# Patient Record
Sex: Female | Born: 2005 | Race: Black or African American | Hispanic: No | Marital: Single | Smoking: Former smoker
Health system: Southern US, Community
[De-identification: ages and names within clinical notes are randomized; demographics above are authoritative.]

## PROBLEM LIST (undated history)

## (undated) ENCOUNTER — Inpatient Hospital Stay (HOSPITAL_COMMUNITY): Payer: Self-pay

## (undated) ENCOUNTER — Emergency Department (HOSPITAL_COMMUNITY): Admission: EM | Disposition: A | Discharge status: Home / Self Care | Payer: Medicaid Other

## (undated) DIAGNOSIS — E119 Type 2 diabetes mellitus without complications: Secondary | ICD-10-CM

## (undated) DIAGNOSIS — F32A Depression, unspecified: Secondary | ICD-10-CM

## (undated) DIAGNOSIS — F419 Anxiety disorder, unspecified: Secondary | ICD-10-CM

## (undated) DIAGNOSIS — K5900 Constipation, unspecified: Secondary | ICD-10-CM

## (undated) HISTORY — PX: NO PAST SURGERIES: SHX2092

---
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
## 2024-01-02 ENCOUNTER — Ambulatory Visit (HOSPITAL_COMMUNITY): Admitting: Physician Assistant

## 2024-01-07 ENCOUNTER — Ambulatory Visit: Attending: Obstetrics and Gynecology | Admitting: Obstetrics and Gynecology

## 2024-01-07 ENCOUNTER — Ambulatory Visit (HOSPITAL_BASED_OUTPATIENT_CLINIC_OR_DEPARTMENT_OTHER)

## 2024-01-07 VITALS — BP 103/70

## 2024-01-07 DIAGNOSIS — O358XX0 Maternal care for other (suspected) fetal abnormality and damage, not applicable or unspecified: Secondary | ICD-10-CM | POA: Diagnosis not present

## 2024-01-07 DIAGNOSIS — Z363 Encounter for antenatal screening for malformations: Secondary | ICD-10-CM | POA: Insufficient documentation

## 2024-01-07 DIAGNOSIS — O0990 Supervision of high risk pregnancy, unspecified, unspecified trimester: Secondary | ICD-10-CM

## 2024-01-07 DIAGNOSIS — O34219 Maternal care for unspecified type scar from previous cesarean delivery: Secondary | ICD-10-CM | POA: Diagnosis not present

## 2024-01-07 DIAGNOSIS — O99212 Obesity complicating pregnancy, second trimester: Secondary | ICD-10-CM

## 2024-01-07 DIAGNOSIS — O283 Abnormal ultrasonic finding on antenatal screening of mother: Secondary | ICD-10-CM

## 2024-01-07 DIAGNOSIS — Z3A22 22 weeks gestation of pregnancy: Secondary | ICD-10-CM | POA: Diagnosis not present

## 2024-01-07 DIAGNOSIS — O99210 Obesity complicating pregnancy, unspecified trimester: Secondary | ICD-10-CM | POA: Diagnosis not present

## 2024-01-07 DIAGNOSIS — E669 Obesity, unspecified: Secondary | ICD-10-CM | POA: Diagnosis not present

## 2024-01-07 DIAGNOSIS — O35BXX0 Maternal care for other (suspected) fetal abnormality and damage, fetal cardiac anomalies, not applicable or unspecified: Secondary | ICD-10-CM

## 2024-01-07 DIAGNOSIS — O09892 Supervision of other high risk pregnancies, second trimester: Secondary | ICD-10-CM | POA: Diagnosis not present

## 2024-01-07 NOTE — Progress Notes (Unsigned)
 Maternal-Fetal Medicine Consultation  Name: Sandra Booth  MRN: [id number]  GA: [id number] [redacted]w[redacted]d   Patient is here for fetal anatomy scan.  On cell-free fetal DNA screening, the risks of aneuploidies are not increased. MSAFP screening showed low risk for open-neural tube defects. Obstetrical history is significant for a term cesarean delivery in July 2024 of a female infant weighing 7 pounds and 2 ounces at birth.  Cesarean section was performed because of nonreassuring fetal heart trace. Ultrasound Fetal biometry is consistent with her previously-established dates. Normal amniotic fluid.  An echogenic intracardiac focus is seen.  No other makers of aneuploidies or fetal structural defects are seen.  Patient understands the limitations of ultrasound in detecting fetal anomalies.   Echogenic intracardiac focus (EIF) EIF is present in about 3% to 4% of normal fetuses and in some fetuses with Down syndrome.  Given that she has low risk for fetal Down syndrome on cell-free fetal DNA screening, this should be considered a normal variant.  I did not recommend amniocentesis for this finding.  I counseled the patient that cell free fetal DNA screening has a greater detection rate for Down syndrome. However, only amniocentesis will give a definitive result on the fetal karyotype.  I explained amniocentesis procedure and possible complication of miscarriage (1 and 500 procedures). Patient opted not to have amniocentesis.  Previous cesarean delivery I reassured the patient of normal placental location and that there is no evidence of previa or placenta accreta spectrum.   I discussed the benefits and risks of vaginal birth after cesarean section (VBAC).  The risk of uterine scar dehiscence is about 1%.  Cesarean deliveries increase the risks of hemorrhage, infection and venous thromboembolism.  Repeat cesarean deliveries increase the risks of placenta previa and/or placenta accreta spectrum. Long-term  complications include small bowel obstruction.  Based on Maternal-Fetal [facility] calculator, the likelihood of successful vaginal delivery is 60%. Patient was counseled that this is only an approximate calculation.  Patient is keen on VBAC.  Short Interpregnancy Interval It is defined as the time interval (18 to 24 months) between the end of previous pregnancy and the beginning of next pregnancy. The impact of short pregnancy interval on the outcome of subsequent pregnancy is uncertain. Some studies have shown that congenital anomalies, preterm delivery, and fetal growth restriction rates are increased in pregnancies with short interpregnancy interval.  However, it is not supported by other reports. Overall, we should expect good pregnancy outcomes if there are no other high-risk factors.  Teen pregnancy Teen pregnancy is associated with increased risk of adverse outcomes including preeclampsia, fetal growth restriction, preterm birth and increased to perinatal mortality.  Substance use disorder and mental health problems have been reported to be associated with teen pregnancy. I counseled the patient on teen pregnancy and recommended a fetal growth assessment at [redacted] weeks gestation.  Recommendations - Fetal growth assessment in 10 weeks.   Consultation including face-to-face (more than 50%) counseling 45 minutes.

## 2024-01-08 ENCOUNTER — Other Ambulatory Visit: Payer: Self-pay | Admitting: *Deleted

## 2024-01-08 ENCOUNTER — Ambulatory Visit: Payer: Self-pay | Admitting: Family Medicine

## 2024-01-08 DIAGNOSIS — O09892 Supervision of other high risk pregnancies, second trimester: Secondary | ICD-10-CM

## 2024-01-08 DIAGNOSIS — O0990 Supervision of high risk pregnancy, unspecified, unspecified trimester: Secondary | ICD-10-CM

## 2024-01-08 DIAGNOSIS — O99212 Obesity complicating pregnancy, second trimester: Secondary | ICD-10-CM

## 2024-01-08 DIAGNOSIS — O358XX0 Maternal care for other (suspected) fetal abnormality and damage, not applicable or unspecified: Secondary | ICD-10-CM

## 2024-01-15 ENCOUNTER — Encounter (HOSPITAL_COMMUNITY): Admitting: Psychiatry

## 2024-01-16 ENCOUNTER — Encounter (HOSPITAL_COMMUNITY): Payer: Self-pay

## 2024-01-21 ENCOUNTER — Other Ambulatory Visit: Payer: Self-pay

## 2024-01-21 ENCOUNTER — Ambulatory Visit

## 2024-01-21 ENCOUNTER — Ambulatory Visit (HOSPITAL_COMMUNITY): Admitting: Clinical

## 2024-01-21 VITALS — BP 107/62 | HR 111 | Wt 199.0 lb

## 2024-01-21 DIAGNOSIS — O09892 Supervision of other high risk pregnancies, second trimester: Secondary | ICD-10-CM

## 2024-01-21 DIAGNOSIS — Z3A24 24 weeks gestation of pregnancy: Secondary | ICD-10-CM

## 2024-01-21 DIAGNOSIS — Z98891 History of uterine scar from previous surgery: Secondary | ICD-10-CM

## 2024-01-21 DIAGNOSIS — F5083 Pica in adults: Secondary | ICD-10-CM

## 2024-01-21 DIAGNOSIS — F419 Anxiety disorder, unspecified: Secondary | ICD-10-CM

## 2024-01-21 DIAGNOSIS — F322 Major depressive disorder, single episode, severe without psychotic features: Secondary | ICD-10-CM

## 2024-01-21 DIAGNOSIS — O99210 Obesity complicating pregnancy, unspecified trimester: Secondary | ICD-10-CM

## 2024-01-21 NOTE — Patient Instructions (Signed)
 Safe Medications in Pregnancy  - Take medications as directed on the package. Medications are listed by Brand name, store brands are considered equivalent to brand name, just be sure that the medications are the same. Ex: Tylenol (acetaminophen) - If taking multiple medications, please check labels to avoid duplicating the same active ingredients - Do not exceed 4000 mg of Tylenol (acetaminophen) in 24 hours - Do not take medications that contain aspirin  or ibuprofen  (Motrin , Advil ) or naproxen (Aleve, Naprosyn)  Acne Benzoyl Peroxide Salicylic Acid  Allergies Benadryl  (diphenhydramine ) Claritin (loratadine) Flonase nasal spray (fluticasone) Saline nasal spray/drops  Backache/Headache Acetaminophen (Tylenol): 2 regular strength (325mg ) every 4 hours OR 2 extra strength (500mg ) every 6 hours  Colds/Coughs Breathe right strips Cepacol throat lozenges OR Chloraseptic throat spray cough drops, alcohol free Delsym (dextromethorphan, cough suppressant) Mucinex (guaifenesin, mucolytic/expectorant) Robitussin DM (dextromethorphan + guaifenesin) Saline nasal spray/drops Sudafed (pseudoephedrine, decongestant)  only after [redacted] weeks gestation and if you do not have high blood pressure Vicks Vaporub Zinc lozenges Zyrtec (cetirizine)  Constipation Immediate relief Ducolax suppositories (bisacodyl) Fleet enema (saline enema) glycerin suppositories milk of magnesia Senokot (overnight) Smooth move tea (overnight)  to keep you regular Colace, Dulcolax (docusate) Metamucil (psyllium fiber) Miralax (polyethylene glycol)   Diarrhea Kaopectate (bismuth subsalicylate) Imodium A-D (loperamide) *NO pepto Bismol Hemorrhoids Anusol   Anusol  HC (Rx only) Preparation H Tucks  Indigestion Tums Maalox Mylanta Pepcid  Insomnia Benadryl  (alcohol free) 25mg  every 6 hours as needed Tylenol PM Unisom, no Gelcaps  Leg  Cramps Tums MagGel  Nausea/Vomiting Bonine Dramamine Emetrol Ginger extract Sea bands Meclizine (Rx only)  Nausea medication to take during pregnancy Unisom (doxylamine succinate 25 mg tablets) Take one half tablet daily at bedtime. Vitamin B6 100mg  tablets. Take one tablet twice a day (up to 200 mg per day).  Skin Rashes: Aveeno products Benadryl  cream or 25mg  pill every 6 hours as needed Calamine Lotion 1% cortisone cream  Yeast infection: Gyne-lotrimin 7 Monistat 7

## 2024-01-21 NOTE — Progress Notes (Signed)
   PRENATAL VISIT NOTE Subjective:  Sandra Booth is a 18 y.o. G2P1001 at [redacted]w[redacted]d being seen today for ongoing prenatal care.  She is currently monitored for the following issues for this high-risk pregnancy and  Patient Active Problem List   Diagnosis Date Noted   High risk teen pregnancy 12/24/2023   Obesity affecting pregnancy, antepartum 12/24/2023   Short interval between pregnancies affecting pregnancy in second trimester, antepartum 11/27/2023   Supervision of high risk pregnancy, antepartum 10/16/2023   History of cesarean delivery 08/28/2022   Carbon monoxide exposure 08/21/2022   Pica in adults 03/21/2022   At risk for domestic violence 03/17/2022   Anxiety 02/17/2022   Chronic constipation 02/16/2022   Cannabis use disorder, mild, abuse 02/23/2021   MDD (major depressive disorder), single episode, severe , no psychosis (HCC) 02/22/2021    Patient reports no complaints.   Contractions: Not present.  Vag. Bleeding: None.   Movement: Present.  Denies leaking of fluid.    The following portions of the patient's history were reviewed and updated as appropriate: allergies, current medications, past family history, past medical history, past social history, past surgical history and problem list. Problem list updated.  Objective:   Vitals:   01/21/24 1534  BP: 107/62  Pulse: (!) 111  Weight: 199 lb (90.3 kg)    Fetal Status: Fetal Heart Rate (bpm): 141 Fundal Height: 26 cm Movement: Present     General:  Alert, oriented and cooperative. Patient is in no acute distress.  Skin: Skin is warm and dry. No rash noted.   Cardiovascular: Normal heart rate noted  Respiratory: Normal respiratory effort, no problems with respiration noted  Abdomen: Soft, gravid, appropriate for gestational age. Pain/Pressure: Absent     Pelvic: Vag. Bleeding: None     Cervical exam deferred        Extremities: Normal range of motion.  Edema: None  Mental Status: Normal mood and affect. Normal  behavior. Normal judgment and thought content.   Urinalysis:      Assessment and Plan:  Sandra Booth is 18 y.o. G2P1001 at [redacted]w[redacted]d, 05/11/2024, by Last Menstrual Period  1. High risk teen pregnancy in second trimester (Primary) 2. Obesity affecting pregnancy, antepartum, unspecified obesity type 3. [redacted] weeks gestation of pregnancy BP, FHT, and FH appropriate. Anatomy with EICF, however low risk NIPT. Declined amniocentesis.   4. History of cesarean delivery 5. Short interval between pregnancies affecting pregnancy in second trimester, antepartum Desires TOLAC. G1 08/2022, pLTCS for NRFHT. Per MFM 01/07/2024, 60% likelihood of successful VBAC.   6. MDD (major depressive disorder), single episode, severe , no psychosis (HCC) 7. Anxiety 7. Pica in adults Engaged with [facility]. Appt today and tomorrow.    Preterm labor symptoms and general obstetric precautions including but not limited to vaginal bleeding, contractions, leaking of fluid and fetal movement were reviewed in detail with the patient. Please refer to After Visit Summary for other counseling recommendations.   Return in about 4 weeks (around 02/18/2024) for [facility], 28wk lab. Cancel 12/12 appts   Barabara Maier, DO FM-OB Fellow [facility] for [employer]

## 2024-01-22 ENCOUNTER — Encounter (HOSPITAL_COMMUNITY): Admitting: Psychiatry

## 2024-01-25 ENCOUNTER — Other Ambulatory Visit

## 2024-01-25 ENCOUNTER — Encounter: Admitting: Family Medicine

## 2024-01-30 ENCOUNTER — Encounter (HOSPITAL_COMMUNITY): Payer: Self-pay | Admitting: Obstetrics & Gynecology

## 2024-01-30 ENCOUNTER — Other Ambulatory Visit: Payer: Self-pay

## 2024-01-30 ENCOUNTER — Inpatient Hospital Stay (HOSPITAL_COMMUNITY)
Admission: AD | Admit: 2024-01-30 | Discharge: 2024-01-30 | Disposition: A | Discharge status: Home / Self Care | Source: Home / Self Care | Attending: Obstetrics & Gynecology | Admitting: Obstetrics & Gynecology

## 2024-01-30 DIAGNOSIS — Z3689 Encounter for other specified antenatal screening: Secondary | ICD-10-CM | POA: Diagnosis not present

## 2024-01-30 DIAGNOSIS — Z3492 Encounter for supervision of normal pregnancy, unspecified, second trimester: Secondary | ICD-10-CM

## 2024-01-30 DIAGNOSIS — O99891 Other specified diseases and conditions complicating pregnancy: Secondary | ICD-10-CM | POA: Insufficient documentation

## 2024-01-30 DIAGNOSIS — Z3A25 25 weeks gestation of pregnancy: Secondary | ICD-10-CM

## 2024-01-30 DIAGNOSIS — O368120 Decreased fetal movements, second trimester, not applicable or unspecified: Secondary | ICD-10-CM | POA: Insufficient documentation

## 2024-01-30 DIAGNOSIS — R040 Epistaxis: Secondary | ICD-10-CM | POA: Diagnosis not present

## 2024-01-30 LAB — URINALYSIS, ROUTINE W REFLEX MICROSCOPIC
Bilirubin Urine: NEGATIVE
Glucose, UA: NEGATIVE mg/dL
Hgb urine dipstick: NEGATIVE
Ketones, ur: NEGATIVE mg/dL
Nitrite: NEGATIVE
Protein, ur: NEGATIVE mg/dL
Specific Gravity, Urine: 1.018 (ref 1.005–1.030)
pH: 7 (ref 5.0–8.0)

## 2024-01-30 NOTE — Discharge Instructions (Signed)
 What is a fetal movement count?  A fetal movement count  is the number of times that you feel your baby move during a certain amount of time. This may also be called a fetal kick count by some people, but fetal movement count is a more accurate term. Movements can be kicks, flutters, swishes, rolls, or jabs. A fetal movement count is recommended for every pregnant woman. You may be asked to start counting fetal movements as early as week 28 of your pregnancy. Pay attention to when your baby is most active. Your baby has times of activity and times to sleep just like you do! You may notice your baby's sleep and wake cycles. You may also notice things that make your baby move more. You should do a fetal movement count: When your baby is normally most active. At the same time each day. A good time to count movements is while you are resting, after having something to eat and drink.  How do I count fetal movements? Find a quiet, comfortable area. Sit, or lie down on your side. Write down the date, the start time and stop time, and the number of movements that you felt between those two times. Take this information with you to your health care visits. Write down your start time when you feel the first movement. Count kicks, flutters, swishes, rolls, and jabs. You should feel at least 10 movements. You may stop counting after you have felt 10 movements, or if you have been counting for 2 hours. Write down the stop time. If you do not feel 10 movements in 2 hours, contact your health care provider for further instructions. Your health care provider may want to do additional tests to assess your baby's well-being. Contact a health care provider if: You feel fewer than 10 movements in 2 hours. Your baby is not moving like he or she usually does.

## 2024-01-30 NOTE — MAU Note (Signed)
 RN offered bus transportation for discharge home. Patient declined, and will arrange her own transportation home.

## 2024-01-30 NOTE — MAU Note (Signed)
 MAU Triage Note  Sandra Booth is a 18 y.o. at [redacted]w[redacted]d here in MAU reporting: arrived by EMS; has only felt baby move 3 times today. Reports baby is normally very active. Denies VB and LOF.    Onset of complaint: today Pain score: denies Vitals:   01/30/24 2148  BP: 118/87  Pulse: 94  Resp: 17  Temp: 98.5 F (36.9 C)  SpO2: 98%     FHT: 141  Lab orders placed from triage: ua

## 2024-01-30 NOTE — MAU Provider Note (Signed)
 Chief Complaint:  Decreased Fetal Movement   HPI   None     Sandra Booth is a 18 y.o. G2P1001 at [redacted]w[redacted]d who presents to maternity admissions reporting decreased fetal movement. She reports that she has felt fetal movement 3 times today, which is less than she is used to. Denies vaginal bleeding, leaking of fluid, contractions. She does note blood in the tissue after blowing her nose today.  Pregnancy Course: Receives care at [facility] [employer] [facility]. Prenatal records reviewed.   Past Medical History:  Diagnosis Date   Anxiety    Constipation    Depression    doing better-situational   OB History  Gravida Para Term Preterm AB Living  2 1 1  0 0 1  SAB IAB Ectopic Multiple Live Births  0 0 0 0 1    # Outcome Date GA Lbr Len/2nd Weight Sex Type Anes PTL Lv  2 Current           1 Term 08/28/22 [redacted]w[redacted]d  3250 g F CS-LVertical EPI  LIV   Past Surgical History:  Procedure Laterality Date   CESAREAN SECTION N/A 08/28/2022   Procedure: CESAREAN SECTION;  Surgeon: Jayne Vonn DEL, MD;  Location: [facility] LD ORS;  Service: Obstetrics;  Laterality: N/A;   Family History  Problem Relation Age of Onset   Diabetes Mother    Sickle cell anemia Mother    Anemia Father    Diabetes Brother    Heart Problems Paternal Grandmother    Social History[1] Allergies[2] Medications Prior to Admission  Medication Sig Dispense Refill Last Dose/Taking   acetaminophen  (TYLENOL ) 325 MG tablet Take 2 tablets (650 mg total) by mouth every 6 (six) hours as needed for mild pain (pain score 1-3) or moderate pain (pain score 4-6). (Patient not taking: Reported on 01/21/2024) 30 tablet 0    polyethylene glycol powder (MIRALAX ) 17 GM/SCOOP powder Take 17 g by mouth 4 (four) times daily as needed for mild constipation or moderate constipation. Titrate to one to three soft bowel movements per day (Patient not taking: Reported on 01/21/2024) 850 g 0    prenatal vitamin w/FE, FA  (PRENATAL 1 + 1) 27-1 MG TABS tablet Take 1 tablet by mouth daily at 12 noon. (Patient not taking: Reported on 01/21/2024) 30 tablet 11    senna (SENOKOT) 8.6 MG TABS tablet Take 2 tablets (17.2 mg total) by mouth at bedtime as needed for mild constipation. (Patient not taking: Reported on 01/21/2024) 180 tablet 3     I have reviewed patient's Past Medical Hx, Surgical Hx, Family Hx, Social Hx, medications and allergies.   ROS  Pertinent items noted in HPI and remainder of comprehensive ROS otherwise negative.   PHYSICAL EXAM  Patient Vitals for the past 24 hrs:  BP Temp Temp src Pulse Resp SpO2  01/30/24 2159 95/75 -- -- (!) 103 -- 98 %  01/30/24 2148 118/87 98.5 F (36.9 C) Oral 94 17 98 %    Constitutional: Well-developed, well-nourished female in no acute distress.  HEENT: atraumatic, normocephalic. Neck has normal ROM. EOM intact. Cardiovascular: normal rate & rhythm, warm and well-perfused Respiratory: normal effort, no problems with respiration noted GI: Abd soft, non-tender, non-distended MSK: Extremities nontender, no edema, normal ROM Skin: warm and dry. Acyanotic, no jaundice or pallor. Neurologic: Alert and oriented x 4. No abnormal coordination. Psychiatric: Normal mood. Speech not slurred, not rapid/pressured. Patient is cooperative.  Fetal Tracing: Baseline FHR: 140 per  minute, changed to 130 per minute at 2206 Fetal heart variability: moderate Fetal Heart Rate accelerations: yes - 10x10 appropriate for gestational age Fetal Heart Rate decelerations: none Fetal Non-stress Test: Category I (reactive) Toco: no uterine contractions   Labs: No results found for this or any previous visit (from the past 24 hours).  Imaging:  No results found.  MDM & MAU COURSE  MDM: Moderate  MAU Course: -Vital signs within normal limits.  -NST appropriate and reactive for gestational age. -Patient endorses fetal movement while in MAU.  Differential diagnosis considered for  decreased fetal movement includes but is not limited to: fetal sleep, poor maternal perception of movement, medications, early gestational age, decreased/increased amniotic fluid volume, maternal position (sitting or standing versus lying), fetal position (anterior position of the fetal spine), anterior placenta, maternal physical activity   Orders Placed This Encounter  Procedures   Urinalysis, Routine w reflex microscopic -Urine, Clean Catch   Discharge patient   No orders of the defined types were placed in this encounter.   ASSESSMENT   1. NST (non-stress test) reactive   2. Movement of fetus present during pregnancy in second trimester   3. [redacted] weeks gestation of pregnancy     PLAN  Discharge home in stable condition with return precautions.  Discussed fetal movement counts and provided additional information in AVS.  Reassured patient that nose bleeds can be normal during cold and dry weather if they stop with pressure, nasal saline spray is safe to use to prevent.    Allergies as of 01/30/2024   No Known Allergies      Medication List     TAKE these medications    acetaminophen  325 MG tablet Commonly known as: Tylenol  Take 2 tablets (650 mg total) by mouth every 6 (six) hours as needed for mild pain (pain score 1-3) or moderate pain (pain score 4-6).   polyethylene glycol powder 17 GM/SCOOP powder Commonly known as: MiraLax  Take 17 g by mouth 4 (four) times daily as needed for mild constipation or moderate constipation. Titrate to one to three soft bowel movements per day   prenatal vitamin w/FE, FA 27-1 MG Tabs tablet Take 1 tablet by mouth daily at 12 noon.   senna 8.6 MG Tabs tablet Commonly known as: SENOKOT Take 2 tablets (17.2 mg total) by mouth at bedtime as needed for mild constipation.        Joesph DELENA Sear, PA     [1]  Social History Tobacco Use   Smoking status: Former    Types: Cigars    Passive exposure: Current   Smokeless tobacco:  Never  Vaping Use   Vaping status: Some Days   Last attempt to quit: 05/29/2022   Substances: Nicotine, Flavoring  Substance Use Topics   Alcohol use: Not Currently    Comment: twice a year prior to pregnancy   Drug use: Yes    Frequency: 7.0 times per week    Types: Marijuana    Comment: last marijuana use 01/2024  [2] No Known Allergies

## 2024-02-19 ENCOUNTER — Encounter: Payer: Self-pay | Admitting: Family Medicine

## 2024-02-19 ENCOUNTER — Other Ambulatory Visit: Payer: Self-pay

## 2024-02-19 ENCOUNTER — Other Ambulatory Visit

## 2024-02-19 ENCOUNTER — Ambulatory Visit (INDEPENDENT_AMBULATORY_CARE_PROVIDER_SITE_OTHER): Admitting: Family Medicine

## 2024-02-19 VITALS — BP 102/67 | HR 101 | Wt 204.5 lb

## 2024-02-19 DIAGNOSIS — O0990 Supervision of high risk pregnancy, unspecified, unspecified trimester: Secondary | ICD-10-CM | POA: Diagnosis not present

## 2024-02-19 DIAGNOSIS — Z23 Encounter for immunization: Secondary | ICD-10-CM | POA: Diagnosis not present

## 2024-02-19 DIAGNOSIS — F5083 Pica in adults: Secondary | ICD-10-CM

## 2024-02-19 DIAGNOSIS — Z3A28 28 weeks gestation of pregnancy: Secondary | ICD-10-CM

## 2024-02-19 DIAGNOSIS — Z9189 Other specified personal risk factors, not elsewhere classified: Secondary | ICD-10-CM | POA: Diagnosis not present

## 2024-02-19 DIAGNOSIS — O09892 Supervision of other high risk pregnancies, second trimester: Secondary | ICD-10-CM | POA: Diagnosis not present

## 2024-02-19 DIAGNOSIS — F419 Anxiety disorder, unspecified: Secondary | ICD-10-CM | POA: Diagnosis not present

## 2024-02-19 DIAGNOSIS — O0993 Supervision of high risk pregnancy, unspecified, third trimester: Secondary | ICD-10-CM

## 2024-02-19 DIAGNOSIS — Z98891 History of uterine scar from previous surgery: Secondary | ICD-10-CM | POA: Diagnosis not present

## 2024-02-19 MED ORDER — SERTRALINE HCL 50 MG PO TABS: ORAL_TABLET | ORAL | 0 refills | Status: AC

## 2024-02-19 MED ORDER — PRENATAL PLUS 27-1 MG PO TABS: 1.0000 | ORAL_TABLET | Freq: Every day | ORAL | 11 refills | Status: AC

## 2024-02-19 NOTE — Patient Instructions (Signed)

## 2024-02-19 NOTE — Progress Notes (Signed)
" ° °  Subjective:  Sandra Booth is a 19 y.o. G2P1001 at [redacted]w[redacted]d being seen today for ongoing prenatal care.  She is currently monitored for the following issues for this high-risk pregnancy and has MDD (major depressive disorder), single episode, severe , no psychosis (HCC); Cannabis use disorder, mild, abuse; Chronic constipation; Anxiety; At risk for domestic violence; Pica in adults; Carbon monoxide exposure; History of cesarean delivery; Supervision of high risk pregnancy, antepartum; Short interval between pregnancies affecting pregnancy in second trimester, antepartum; High risk teen pregnancy; and Obesity affecting pregnancy, antepartum on their problem list.  Patient reports no complaints.  Contractions: Not present. Vag. Bleeding: None.  Movement: Present. Denies leaking of fluid.   The following portions of the patient's history were reviewed and updated as appropriate: allergies, current medications, past family history, past medical history, past social history, past surgical history and problem list. Problem list updated.  Objective:   Vitals:   02/19/24 0832  BP: 102/67  Pulse: (!) 101  Weight: 204 lb 8 oz (92.8 kg)    Fetal Status: Fetal Heart Rate (bpm): 155   Movement: Present     General:  Alert, oriented and cooperative. Patient is in no acute distress.  Skin: Skin is warm and dry. No rash noted.   Cardiovascular: Normal heart rate noted  Respiratory: Normal respiratory effort, no problems with respiration noted  Abdomen: Soft, gravid, appropriate for gestational age. Pain/Pressure: Present     Pelvic: Vag. Bleeding: None     Cervical exam deferred        Extremities: Normal range of motion.  Edema: None  Mental Status: Normal mood and affect. Normal behavior. Normal judgment and thought content.   Urinalysis:      Assessment and Plan:  Pregnancy: G2P1001 at [redacted]w[redacted]d  1. [redacted] weeks gestation of pregnancy (Primary)  - Tdap vaccine greater than or equal to 7yo IM  2.  Supervision of high risk pregnancy, antepartum BP and FHR normal Third trimester labs planned for today however she drank some juice just prior to getting here, will schedule for next visit TDAP given today, already received flu shot in fall Discussed contraception at length. Desires LARC, plan for post placental IUD  3. History of cesarean delivery Discussed TOLAC, had many excellent questions Reviewed form in detail, she would like to [facility] Also discussed last labor course, she was 41 weeks and induced for post dates and DFM Got to 7 cm and then unable to augment due to fetal intolerance Discussed ideally she'll go into labor naturally but if not could consider 40 wk IOL  4. At risk for domestic violence Had 50B in place, reports she needs to get it redone, encouraged to get [facility] at [facility]  5. Anxiety Referred to [facility] but has no showed multiple times, encouraged to reschedule Also desires to restart zoloft , rx sent  6. Pica in adults Reports its going horribly lately, still eating deodorant Encouraged to go to [facility] and resume zoloft   7. Short interval between pregnancies affecting pregnancy in second trimester, antepartum Last delivery 08/2022 by cesarean   Preterm labor symptoms and general obstetric precautions including but not limited to vaginal bleeding, contractions, leaking of fluid and fetal movement were reviewed in detail with the patient. Please refer to After Visit Summary for other counseling recommendations.  No follow-ups on file.   Lola Donnice HERO, MD "

## 2024-02-20 ENCOUNTER — Encounter: Payer: Self-pay | Admitting: *Deleted

## 2024-02-21 ENCOUNTER — Other Ambulatory Visit: Payer: Self-pay

## 2024-02-21 DIAGNOSIS — O0990 Supervision of high risk pregnancy, unspecified, unspecified trimester: Secondary | ICD-10-CM

## 2024-02-22 ENCOUNTER — Encounter (HOSPITAL_COMMUNITY): Payer: Self-pay | Admitting: Obstetrics & Gynecology

## 2024-02-22 ENCOUNTER — Inpatient Hospital Stay (HOSPITAL_COMMUNITY)
Admission: AD | Admit: 2024-02-22 | Discharge: 2024-02-22 | Disposition: A | Discharge status: Home / Self Care | Attending: Obstetrics & Gynecology | Admitting: Obstetrics & Gynecology

## 2024-02-22 DIAGNOSIS — O99343 Other mental disorders complicating pregnancy, third trimester: Secondary | ICD-10-CM | POA: Diagnosis present

## 2024-02-22 DIAGNOSIS — M25472 Effusion, left ankle: Secondary | ICD-10-CM | POA: Diagnosis not present

## 2024-02-22 DIAGNOSIS — Z87891 Personal history of nicotine dependence: Secondary | ICD-10-CM | POA: Insufficient documentation

## 2024-02-22 DIAGNOSIS — O99013 Anemia complicating pregnancy, third trimester: Secondary | ICD-10-CM | POA: Insufficient documentation

## 2024-02-22 DIAGNOSIS — R079 Chest pain, unspecified: Secondary | ICD-10-CM | POA: Insufficient documentation

## 2024-02-22 DIAGNOSIS — Z3A28 28 weeks gestation of pregnancy: Secondary | ICD-10-CM | POA: Diagnosis not present

## 2024-02-22 DIAGNOSIS — R12 Heartburn: Secondary | ICD-10-CM | POA: Diagnosis not present

## 2024-02-22 DIAGNOSIS — Z3689 Encounter for other specified antenatal screening: Secondary | ICD-10-CM

## 2024-02-22 DIAGNOSIS — O0990 Supervision of high risk pregnancy, unspecified, unspecified trimester: Secondary | ICD-10-CM

## 2024-02-22 DIAGNOSIS — O1203 Gestational edema, third trimester: Secondary | ICD-10-CM | POA: Diagnosis present

## 2024-02-22 DIAGNOSIS — Z79899 Other long term (current) drug therapy: Secondary | ICD-10-CM | POA: Insufficient documentation

## 2024-02-22 DIAGNOSIS — Z9189 Other specified personal risk factors, not elsewhere classified: Secondary | ICD-10-CM

## 2024-02-22 DIAGNOSIS — M25471 Effusion, right ankle: Secondary | ICD-10-CM | POA: Diagnosis not present

## 2024-02-22 DIAGNOSIS — O99891 Other specified diseases and conditions complicating pregnancy: Secondary | ICD-10-CM

## 2024-02-22 LAB — CBC WITH DIFFERENTIAL/PLATELET
Abs Immature Granulocytes: 0.07 K/uL (ref 0.00–0.07)
Basophils Absolute: 0 K/uL (ref 0.0–0.1)
Basophils Relative: 0 %
Eosinophils Absolute: 0.2 K/uL (ref 0.0–0.5)
Eosinophils Relative: 2 %
HCT: 30.5 % — ABNORMAL LOW (ref 36.0–46.0)
Hemoglobin: 9.4 g/dL — ABNORMAL LOW (ref 12.0–15.0)
Immature Granulocytes: 1 %
Lymphocytes Relative: 27 %
Lymphs Abs: 2.8 K/uL (ref 0.7–4.0)
MCH: 23 pg — ABNORMAL LOW (ref 26.0–34.0)
MCHC: 30.8 g/dL (ref 30.0–36.0)
MCV: 74.8 fL — ABNORMAL LOW (ref 80.0–100.0)
Monocytes Absolute: 0.8 K/uL (ref 0.1–1.0)
Monocytes Relative: 8 %
Neutro Abs: 6.6 K/uL (ref 1.7–7.7)
Neutrophils Relative %: 62 %
Platelets: 194 K/uL (ref 150–400)
RBC: 4.08 MIL/uL (ref 3.87–5.11)
RDW: 14.9 % (ref 11.5–15.5)
WBC: 10.4 K/uL (ref 4.0–10.5)
nRBC: 0 % (ref 0.0–0.2)

## 2024-02-22 LAB — COMPREHENSIVE METABOLIC PANEL WITH GFR
ALT: 8 U/L (ref 0–44)
AST: 20 U/L (ref 15–41)
Albumin: 3.5 g/dL (ref 3.5–5.0)
Alkaline Phosphatase: 116 U/L (ref 38–126)
Anion gap: 8 (ref 5–15)
BUN: 6 mg/dL (ref 6–20)
CO2: 23 mmol/L (ref 22–32)
Calcium: 8.6 mg/dL — ABNORMAL LOW (ref 8.9–10.3)
Chloride: 103 mmol/L (ref 98–111)
Creatinine, Ser: 0.6 mg/dL (ref 0.44–1.00)
GFR, Estimated: >60 mL/min
Glucose, Bld: 100 mg/dL — ABNORMAL HIGH (ref 70–99)
Potassium: 4.1 mmol/L (ref 3.5–5.1)
Sodium: 134 mmol/L — ABNORMAL LOW (ref 135–145)
Total Bilirubin: 0.3 mg/dL (ref 0.0–1.2)
Total Protein: 6.6 g/dL (ref 6.5–8.1)

## 2024-02-22 MED ORDER — FERROUS SULFATE 325 (65 FE) MG PO TBEC: 325.0000 mg | DELAYED_RELEASE_TABLET | ORAL | 1 refills | Status: DC

## 2024-02-22 NOTE — MAU Note (Signed)
 Sandra Booth is a 19 y.o. at [redacted]w[redacted]d here in MAU reporting having intermittent chest pain since yesterday. Pain is sharp over 1-2 secs and then goes away. Also having some swelling in her feet.  Reports good FM and denies LOF or VB.   LMP: na Onset of complaint: Thurs evening Pain score: 2 Vitals:   02/22/24 0155 02/22/24 0158  BP:  110/77  Pulse: 70   Resp: 17   Temp: 98.1 F (36.7 C)   SpO2: 100%      FHT: 132  Lab orders placed from triage: none

## 2024-02-22 NOTE — MAU Provider Note (Signed)
 " History     CSN: [id number]  Arrival date and time: 02/22/24 0137   None     Chief Complaint  Patient presents with   Chest Pain   Sandra Booth is a 19 y.o. G2P1001 at [redacted]w[redacted]d who receives care at [facility].  She presents today for chest pain and ankle edema. She states the chest pain is more like a discomfort that lasts 1-2 seconds.  She states it does not occur that often and quantifies it as occurring every few hours.  She states she takes tums for heartburn and that helps, but this is not that.  She rates the pain a 2-3/10. She states she noted swelling in her ankles, but is having no pain in her legs or feet.  She endorses fetal movement and denies vaginal bleeding, discharge, or contractions.    OB History     Gravida  2   Para  1   Term  1   Preterm  0   AB  0   Living  1      SAB  0   IAB  0   Ectopic  0   Multiple  0   Live Births  1           Past Medical History:  Diagnosis Date   Anxiety    Constipation    Depression    doing better-situational    Past Surgical History:  Procedure Laterality Date   CESAREAN SECTION N/A 08/28/2022   Procedure: CESAREAN SECTION;  Surgeon: Jayne Vonn DEL, MD;  Location: [facility] LD ORS;  Service: Obstetrics;  Laterality: N/A;    Family History  Problem Relation Age of Onset   Diabetes Mother    Sickle cell anemia Mother    Anemia Father    Diabetes Brother    Heart Problems Paternal Grandmother     Social History[1]  Allergies: Allergies[2]  Medications Prior to Admission  Medication Sig Dispense Refill Last Dose/Taking   prenatal vitamin w/FE, FA (PRENATAL 1 + 1) 27-1 MG TABS tablet Take 1 tablet by mouth daily at 12 noon. 30 tablet 11    sertraline  (ZOLOFT ) 50 MG tablet Take 0.5 tablets (25 mg total) by mouth daily for 14 days, THEN 1 tablet (50 mg total) daily for 14 days. 21 tablet 0     Review of Systems  Gastrointestinal:  Negative for abdominal pain, nausea and vomiting.  Genitourinary:   Negative for difficulty urinating, dysuria, vaginal bleeding and vaginal discharge.  Neurological:  Negative for dizziness, light-headedness and headaches.   Physical Exam   Blood pressure 126/73, pulse 77, temperature 98.1 F (36.7 C), resp. rate 17, height 5' 2 (1.575 m), weight 93 kg, last menstrual period 08/05/2023, SpO2 100%, currently breastfeeding.  Physical Exam Vitals reviewed.  Constitutional:      Appearance: She is well-developed.  HENT:     Head: Normocephalic and atraumatic.  Eyes:     Conjunctiva/sclera: Conjunctivae normal.  Cardiovascular:     Rate and Rhythm: Normal rate.     Heart sounds: Normal heart sounds.  Pulmonary:     Effort: Pulmonary effort is normal.     Breath sounds: No decreased breath sounds.  Abdominal:     Palpations: Abdomen is soft.  Musculoskeletal:        General: Normal range of motion.     Cervical back: Normal range of motion.  Skin:    General: Skin is warm and dry.  Neurological:  Mental Status: She is alert and oriented to person, place, and time.  Psychiatric:        Mood and Affect: Mood normal.        Behavior: Behavior normal.     Fetal Assessment 130 bpm, Mod Var, -Decels, +Accels Toco: No ctx graphed  MAU Course   Results for orders placed or performed during the hospital encounter of 02/22/24 (from the past 24 hours)  CBC with Differential/Platelet     Status: Abnormal   Collection Time: 02/22/24  3:21 AM  Result Value Ref Range   WBC 10.4 4.0 - 10.5 K/uL   RBC 4.08 3.87 - 5.11 MIL/uL   Hemoglobin 9.4 (L) 12.0 - 15.0 g/dL   HCT 69.4 (L) 63.9 - 53.9 %   MCV 74.8 (L) 80.0 - 100.0 fL   MCH 23.0 (L) 26.0 - 34.0 pg   MCHC 30.8 30.0 - 36.0 g/dL   RDW 85.0 88.4 - 84.4 %   Platelets 194 150 - 400 K/uL   nRBC 0.0 0.0 - 0.2 %   Neutrophils Relative % 62 %   Neutro Abs 6.6 1.7 - 7.7 K/uL   Lymphocytes Relative 27 %   Lymphs Abs 2.8 0.7 - 4.0 K/uL   Monocytes Relative 8 %   Monocytes Absolute 0.8 0.1 - 1.0 K/uL    Eosinophils Relative 2 %   Eosinophils Absolute 0.2 0.0 - 0.5 K/uL   Basophils Relative 0 %   Basophils Absolute 0.0 0.0 - 0.1 K/uL   Immature Granulocytes 1 %   Abs Immature Granulocytes 0.07 0.00 - 0.07 K/uL  Comprehensive metabolic panel     Status: Abnormal   Collection Time: 02/22/24  3:21 AM  Result Value Ref Range   Sodium 134 (L) 135 - 145 mmol/L   Potassium 4.1 3.5 - 5.1 mmol/L   Chloride 103 98 - 111 mmol/L   CO2 23 22 - 32 mmol/L   Glucose, Bld 100 (H) 70 - 99 mg/dL   BUN 6 6 - 20 mg/dL   Creatinine, Ser 9.39 0.44 - 1.00 mg/dL   Calcium 8.6 (L) 8.9 - 10.3 mg/dL   Total Protein 6.6 6.5 - 8.1 g/dL   Albumin 3.5 3.5 - 5.0 g/dL   AST 20 15 - 41 U/L   ALT 8 0 - 44 U/L   Alkaline Phosphatase 116 38 - 126 U/L   Total Bilirubin 0.3 0.0 - 1.2 mg/dL   GFR, Estimated >39 >39 mL/min   Anion gap 8 5 - 15   No results found.   MDM PE Labs: CBC/CMP EFM EKG Prescription Assessment and Plan   19 year old G2P1001  SIUP at 28.5 weeks Cat I FT Chest Pain  -POC Reviewed -Exam performed. Informed no apparent edema. Advised usage of compression stockings. -Labs ordered. -Will also collect EKG. -Monitor and await results.    [name] MSN, CNM 02/22/2024, 2:34 AM   Reassessment (4:02 AM) -Results as above. -Provider to bedside to discuss.  -Patient informed of normal EKG. -Reviewed labs showing anemia and recommend starting iron supplement. Patient agreeable.  -Discussed how anemia is associated with pica behaviors.  -Rx sent to pharmacy on file.  -Precautions reviewed. -Encouraged to call primary office or return to MAU if symptoms worsen or with the onset of new symptoms. -Discharged to home in stable condition.  [name] MSN, CNM Advanced Practice Provider, Center for [facility]    [1]  Social History Tobacco Use   Smoking status:  Former    Types: Cigars    Passive exposure: Current   Smokeless tobacco: Never  Vaping Use   Vaping  status: Former   Quit date: 05/29/2022   Substances: Nicotine, Flavoring  Substance Use Topics   Alcohol use: Not Currently    Comment: twice a year prior to pregnancy   Drug use: Yes    Frequency: 7.0 times per week    Types: Marijuana    Comment: last marijuana use 01/2024  [2] No Known Allergies  "

## 2024-02-23 ENCOUNTER — Inpatient Hospital Stay (HOSPITAL_COMMUNITY)
Admission: AD | Admit: 2024-02-23 | Discharge: 2024-02-23 | Disposition: A | Discharge status: Home / Self Care | Source: Ambulatory Visit | Attending: Obstetrics and Gynecology | Admitting: Obstetrics and Gynecology

## 2024-02-23 ENCOUNTER — Encounter (HOSPITAL_COMMUNITY): Payer: Self-pay | Admitting: Obstetrics and Gynecology

## 2024-02-23 DIAGNOSIS — O23593 Infection of other part of genital tract in pregnancy, third trimester: Secondary | ICD-10-CM | POA: Diagnosis present

## 2024-02-23 DIAGNOSIS — B3731 Acute candidiasis of vulva and vagina: Secondary | ICD-10-CM

## 2024-02-23 DIAGNOSIS — B9689 Other specified bacterial agents as the cause of diseases classified elsewhere: Secondary | ICD-10-CM

## 2024-02-23 DIAGNOSIS — O26893 Other specified pregnancy related conditions, third trimester: Secondary | ICD-10-CM

## 2024-02-23 DIAGNOSIS — N760 Acute vaginitis: Secondary | ICD-10-CM | POA: Diagnosis not present

## 2024-02-23 DIAGNOSIS — Z3A28 28 weeks gestation of pregnancy: Secondary | ICD-10-CM | POA: Diagnosis not present

## 2024-02-23 LAB — URINALYSIS, ROUTINE W REFLEX MICROSCOPIC
Bilirubin Urine: NEGATIVE
Glucose, UA: NEGATIVE mg/dL
Hgb urine dipstick: NEGATIVE
Ketones, ur: NEGATIVE mg/dL
Nitrite: NEGATIVE
Protein, ur: NEGATIVE mg/dL
Specific Gravity, Urine: 1.021 (ref 1.005–1.030)
pH: 6 (ref 5.0–8.0)

## 2024-02-23 LAB — WET PREP, GENITAL
Sperm: NONE SEEN
Trich, Wet Prep: NONE SEEN
WBC, Wet Prep HPF POC: >=10 — AB

## 2024-02-23 MED ORDER — TERCONAZOLE 0.8 % VA CREA: 1.0000 | TOPICAL_CREAM | Freq: Every day | VAGINAL | 0 refills | Status: AC

## 2024-02-23 MED ORDER — METRONIDAZOLE 500 MG PO TABS: 500.0000 mg | ORAL_TABLET | Freq: Two times a day (BID) | ORAL | 0 refills | Status: AC

## 2024-02-23 NOTE — MAU Note (Addendum)
 Sandra Booth is a 20 y.o. at [redacted]w[redacted]d here in MAU reporting: around 1100 after wiping after urination she had white, clumpy discharge along with jelly like discharge on the tissue.  Pt also reports pressure when she urinates.  Denies VB and +FM.    Onset of complaint: this morning  Pain score: 0 Vitals:   02/23/24 1524  BP: 115/83  Pulse: 95  Resp: 18  Temp: 98.2 F (36.8 C)  SpO2: 100%     FHT: 138  Lab orders placed from triage: UA and cultures

## 2024-02-23 NOTE — MAU Provider Note (Signed)
 CC/Vaginal discharge     S/ HPI  [name] is a 19 y.o. G2P1001 patient who presents to MAU today with complaint of reporting she is 28 weeks 6 days that around 11 AM after wiping she knows she had a white clumpy discharge along with jellylike discharge.  Patient denies any vaginal bleeding, leaking of fluid, and reports good fetal movements and denies any pain.  She offers no complaints of dysuria or urinary or GI issues at this time  The remainder of the ROS is negative unless otherwise noted in HPI above   O BP 115/83 (BP Location: Right Arm)   Pulse 95   Temp 98.2 F (36.8 C) (Oral)   Resp 18   Ht 5' 2 (1.575 m)   Wt 92.9 kg   LMP 08/05/2023 (Exact Date)   SpO2 100%   BMI 37.48 kg/m  Physical Exam Vitals and nursing note reviewed.  Constitutional:      General: She is not in acute distress.    Appearance: Normal appearance. She is obese. She is not ill-appearing.  HENT:     Head: Normocephalic.  Cardiovascular:     Rate and Rhythm: Normal rate.  Pulmonary:     Effort: Pulmonary effort is normal.  Abdominal:     General: There is no distension.     Palpations: Abdomen is soft.     Tenderness: There is no abdominal tenderness.  Musculoskeletal:        General: Normal range of motion.     Cervical back: Normal range of motion.  Skin:    General: Skin is warm.  Neurological:     Mental Status: She is alert and oriented to person, place, and time.    Patient declined speculum exam or cervical check and requested self swabs only.  NST deferred due to recently seen in 02/22/2024 with reactive NST and patient with no OB complaints at this time   FHR 138  MDM  LOW   Prenatal chart reviewed Physical exam performed U/A Vaginal swabs    Orders Placed This Encounter  Procedures   Wet prep, genital    Standing Status:   Standing    Number of Occurrences:   1   Urinalysis, Routine w reflex microscopic -Urine, Random    Standing Status:   Standing     Number of Occurrences:   1    Specimen Source:   Urine, Random [244]   Discharge patient Discharge disposition: 01-Home or Self Care; Discharge patient date: 02/23/2024    Standing Status:   Standing    Number of Occurrences:   1    Discharge disposition:   01-Home or Self Care [1]    Discharge patient date:   02/23/2024      Results for orders placed or performed during the hospital encounter of 02/23/24 (from the past 24 hours)  Wet prep, genital     Status: Abnormal   Collection Time: 02/23/24  3:55 PM  Result Value Ref Range   Yeast Wet Prep HPF POC PRESENT (A) NONE SEEN   Trich, Wet Prep NONE SEEN NONE SEEN   Clue Cells Wet Prep HPF POC PRESENT (A) NONE SEEN   WBC, Wet Prep HPF POC >=10 (A) <10   Sperm NONE SEEN       ASSESSMENT Medical screening exam complete  Bacterial vaginosis RX sent for Flagyl  500 MG BID x 7 days  Yeast vaginitis RX sent for Terazol 3   [redacted] weeks gestation of  pregnancy FHR     PLAN Future Appointments  Date Time Provider Department Center  02/25/2024  9:00 AM [facility]  03/05/2024  2:35 PM Eldonna Suzen Octave, MD [facility]  03/17/2024 11:00 AM [facility] PROVIDER 1 [facility]  03/17/2024 11:30 AM [facility] US1 [facility]  03/19/2024  3:15 PM Ilean Norleen GAILS, MD [facility]  04/02/2024  3:15 PM Eldonna Suzen Octave, MD [facility]  04/16/2024  3:15 PM Eldonna Suzen Octave, MD [facility]  04/23/2024  3:55 PM [facility]Y DYAD PROVIDER [facility]  04/30/2024  3:15 PM [facility] M[facility] DYAD PROVIDER [facility]  05/07/2024  3:55 PM [facility] MOM BAB[facility] DYAD PROVIDER [facility]  05/14/2024  3:15 PM [facility] MOM B[facility] DYAD PROVIDER [facility]    Discharge from MAU in stable condition  See AVS for full description of educational information and instructions provided to the patient at time of discharge  Warning signs for worsening condition that would warrant emergency follow-up discussed  Patient may return to MAU as needed   Sandra Dalton, MSN, [facility], Center for [employer]

## 2024-02-24 ENCOUNTER — Encounter: Payer: Self-pay | Admitting: Family Medicine

## 2024-02-25 ENCOUNTER — Other Ambulatory Visit: Payer: Self-pay

## 2024-02-25 ENCOUNTER — Other Ambulatory Visit

## 2024-02-25 DIAGNOSIS — O0990 Supervision of high risk pregnancy, unspecified, unspecified trimester: Secondary | ICD-10-CM

## 2024-02-25 LAB — GC/CHLAMYDIA PROBE AMP (~~LOC~~) NOT AT ARMC
Chlamydia: NEGATIVE
Comment: NEGATIVE
Comment: NORMAL
Neisseria Gonorrhea: NEGATIVE

## 2024-02-25 MED ORDER — FERROUS SULFATE 325 (65 FE) MG PO TBEC: 325.0000 mg | DELAYED_RELEASE_TABLET | ORAL | 1 refills | Status: AC

## 2024-02-26 ENCOUNTER — Telehealth: Payer: Self-pay | Admitting: Pharmacy Technician

## 2024-02-26 ENCOUNTER — Ambulatory Visit: Payer: Self-pay | Admitting: Family Medicine

## 2024-02-26 ENCOUNTER — Other Ambulatory Visit (HOSPITAL_COMMUNITY): Payer: Self-pay | Admitting: Family Medicine

## 2024-02-26 DIAGNOSIS — O99013 Anemia complicating pregnancy, third trimester: Secondary | ICD-10-CM

## 2024-02-26 DIAGNOSIS — O0990 Supervision of high risk pregnancy, unspecified, unspecified trimester: Secondary | ICD-10-CM

## 2024-02-26 LAB — CBC
Hematocrit: 28.5 % — ABNORMAL LOW (ref 34.0–46.6)
Hemoglobin: 8.8 g/dL — ABNORMAL LOW (ref 11.1–15.9)
MCH: 22.9 pg — ABNORMAL LOW (ref 26.6–33.0)
MCHC: 30.9 g/dL — ABNORMAL LOW (ref 31.5–35.7)
MCV: 74 fL — ABNORMAL LOW (ref 79–97)
Platelets: 168 x10E3/uL (ref 150–450)
RBC: 3.84 x10E6/uL (ref 3.77–5.28)
RDW: 15.4 % (ref 11.7–15.4)
WBC: 8.2 x10E3/uL (ref 3.4–10.8)

## 2024-02-26 LAB — GLUCOSE TOLERANCE, 2 HOURS W/ 1HR
Glucose, 1 hour: 122 mg/dL (ref 70–179)
Glucose, 2 hour: 103 mg/dL (ref 70–152)
Glucose, Fasting: 87 mg/dL (ref 70–91)

## 2024-02-26 LAB — HIV ANTIBODY (ROUTINE TESTING W REFLEX): HIV Screen 4th Generation wRfx: NONREACTIVE

## 2024-02-26 LAB — SYPHILIS: RPR W/REFLEX TO RPR TITER AND TREPONEMAL ANTIBODIES, TRADITIONAL SCREENING AND DIAGNOSIS ALGORITHM: RPR Ser Ql: NONREACTIVE

## 2024-02-26 NOTE — Telephone Encounter (Signed)
 Sandra Booth, the patient will be scheduled as soon as possible.   Auth Submission: NO AUTH NEEDED Site of care: Site of care: [facility] Payer: [place] [employer] Medication & CPT/J Code(s) submitted: Venofer (Iron Sucrose) J1756 Diagnosis Code: O99.013 Route of submission (phone, fax, portal): n/a Phone # Fax # Auth type: Buy/Bill PB Units/visits requested: 300 mg x 3 Reference number: n/a Approval from: 02/26/2024 to 06/12/2024

## 2024-02-29 ENCOUNTER — Other Ambulatory Visit: Payer: Self-pay

## 2024-02-29 ENCOUNTER — Inpatient Hospital Stay (HOSPITAL_COMMUNITY)
Admission: AD | Admit: 2024-02-29 | Discharge: 2024-02-29 | Disposition: A | Discharge status: Home / Self Care | Payer: Self-pay | Attending: Obstetrics and Gynecology | Admitting: Obstetrics and Gynecology

## 2024-02-29 ENCOUNTER — Encounter (HOSPITAL_COMMUNITY): Payer: Self-pay | Admitting: Obstetrics and Gynecology

## 2024-02-29 DIAGNOSIS — Z3A30 30 weeks gestation of pregnancy: Secondary | ICD-10-CM | POA: Diagnosis not present

## 2024-02-29 DIAGNOSIS — O0990 Supervision of high risk pregnancy, unspecified, unspecified trimester: Secondary | ICD-10-CM

## 2024-02-29 DIAGNOSIS — Z3493 Encounter for supervision of normal pregnancy, unspecified, third trimester: Secondary | ICD-10-CM | POA: Diagnosis present

## 2024-02-29 DIAGNOSIS — O99013 Anemia complicating pregnancy, third trimester: Secondary | ICD-10-CM

## 2024-02-29 DIAGNOSIS — Z3A29 29 weeks gestation of pregnancy: Secondary | ICD-10-CM

## 2024-02-29 DIAGNOSIS — Z3689 Encounter for other specified antenatal screening: Secondary | ICD-10-CM | POA: Diagnosis not present

## 2024-02-29 DIAGNOSIS — Z9189 Other specified personal risk factors, not elsewhere classified: Secondary | ICD-10-CM

## 2024-02-29 NOTE — MAU Note (Signed)
 Sandra Booth is a 19 y.o. at [redacted]w[redacted]d here in MAU reporting: she's having decreased fetal movement for the past couple days, states feels movement but less than usual.  Also states movements are not as strong .  Denies VB and LOF  LMP: 08/05/2023 Onset of complaint: couple days Pain score: 0 Vitals:   02/29/24 0959  BP: 114/69  Pulse: 97  Resp: 18  Temp: 98.2 F (36.8 C)  SpO2: 100%     FHT: 135 bpm  Lab orders placed from triage: None

## 2024-03-04 ENCOUNTER — Ambulatory Visit

## 2024-03-04 VITALS — BP 120/70 | HR 84 | Temp 97.9°F | Resp 16 | Ht 62.0 in | Wt 205.6 lb

## 2024-03-04 DIAGNOSIS — O99013 Anemia complicating pregnancy, third trimester: Secondary | ICD-10-CM | POA: Diagnosis not present

## 2024-03-04 DIAGNOSIS — Z3A30 30 weeks gestation of pregnancy: Secondary | ICD-10-CM | POA: Diagnosis not present

## 2024-03-04 MED ORDER — SODIUM CHLORIDE 0.9 % IV SOLN
300.0000 mg | Freq: Once | INTRAVENOUS | Status: AC
  Administered 2024-03-04: 300 mg via INTRAVENOUS
  Filled 2024-03-04: qty 15

## 2024-03-04 NOTE — Progress Notes (Signed)
 Diagnosis: , Acute Anemia  Provider:  Lonna Coder MD  Procedure: IV Infusion  IV Type: Peripheral, IV Location: R Antecubital  , Venofer  (Iron  Sucrose), Dose: 300 mg  Infusion Start Time: 0942  Infusion Stop Time: 1125  Post Infusion IV Care: Observation period completed and Peripheral IV Discontinued  Discharge: Condition: Good, Destination: Home . AVS Declined  Performed by:  Donny Childes, RN

## 2024-03-05 ENCOUNTER — Other Ambulatory Visit: Payer: Self-pay

## 2024-03-05 ENCOUNTER — Ambulatory Visit: Admitting: Family Medicine

## 2024-03-05 VITALS — BP 100/64 | HR 112 | Wt 204.8 lb

## 2024-03-05 DIAGNOSIS — O99210 Obesity complicating pregnancy, unspecified trimester: Secondary | ICD-10-CM

## 2024-03-05 DIAGNOSIS — Z9189 Other specified personal risk factors, not elsewhere classified: Secondary | ICD-10-CM

## 2024-03-05 DIAGNOSIS — O09892 Supervision of other high risk pregnancies, second trimester: Secondary | ICD-10-CM

## 2024-03-05 DIAGNOSIS — O99013 Anemia complicating pregnancy, third trimester: Secondary | ICD-10-CM | POA: Diagnosis not present

## 2024-03-05 DIAGNOSIS — Z98891 History of uterine scar from previous surgery: Secondary | ICD-10-CM | POA: Diagnosis not present

## 2024-03-05 DIAGNOSIS — O0990 Supervision of high risk pregnancy, unspecified, unspecified trimester: Secondary | ICD-10-CM | POA: Diagnosis not present

## 2024-03-05 DIAGNOSIS — Z3A30 30 weeks gestation of pregnancy: Secondary | ICD-10-CM | POA: Diagnosis not present

## 2024-03-05 DIAGNOSIS — K029 Dental caries, unspecified: Secondary | ICD-10-CM | POA: Diagnosis not present

## 2024-03-05 MED ORDER — METRONIDAZOLE 0.75 % VA GEL: 1.0000 | Freq: Every day | VAGINAL | 1 refills | Status: AC

## 2024-03-05 MED ORDER — AMOXICILLIN 500 MG PO CAPS: 500.0000 mg | ORAL_CAPSULE | Freq: Three times a day (TID) | ORAL | 2 refills | Status: AC

## 2024-03-05 NOTE — Progress Notes (Signed)
 "  PRENATAL VISIT NOTE  Subjective:  Sandra Booth is a 19 y.o. G2P1001 at 109w3d being seen today for ongoing prenatal care.  She is currently monitored for the following issues for this high-risk pregnancy and has MDD (major depressive disorder), single episode, severe , no psychosis (HCC); Cannabis use disorder, mild, abuse; Chronic constipation; Anxiety; At risk for domestic violence; Pica in adults; Carbon monoxide exposure; History of cesarean delivery; Supervision of high risk pregnancy, antepartum; Short interval between pregnancies affecting pregnancy in second trimester, antepartum; High risk teen pregnancy; Obesity affecting pregnancy, antepartum; Anemia in pregnancy, third trimester; and Pain due to dental caries on their problem list.  Patient reports did not tolerate metronidazole ..  Contractions: Not present. Vag. Bleeding: None.  Movement: Present. Denies leaking of fluid.   The following portions of the patient's history were reviewed and updated as appropriate: allergies, current medications, past family history, past medical history, past social history, past surgical history and problem list.   Objective:   Vitals:   03/05/24 1429  BP: 100/64  Pulse: (!) 112  Weight: 204 lb 12.8 oz (92.9 kg)    Fetal Status:  Fetal Heart Rate (bpm): 137   Movement: Present    General: Alert, oriented and cooperative. Patient is in no acute distress.  Skin: Skin is warm and dry. No rash noted.   Cardiovascular: Normal heart rate noted  Respiratory: Normal respiratory effort, no problems with respiration noted  Abdomen: Soft, gravid, appropriate for gestational age.  Pain/Pressure: Absent     Pelvic: Cervical exam deferred        Extremities: Normal range of motion.  Edema: None  Mental Status: Normal mood and affect. Normal behavior. Normal judgment and thought content.      11/21/2023   12:40 PM 10/30/2023   11:52 AM 07/26/2022    3:32 PM  Depression screen PHQ 2/9  Decreased  Interest  2 0  Down, Depressed, Hopeless  0 0  PHQ - 2 Score  2 0  Altered sleeping  2 0  Tired, decreased energy  3 3  Change in appetite  0 0  Feeling bad or failure about yourself   0 0  Trouble concentrating  0 0  Moving slowly or fidgety/restless  0 0  Suicidal thoughts  0 0  PHQ-9 Score  7  3   Difficult doing work/chores  Not difficult at all Not difficult at all     Information is confidential and restricted. Go to Review Flowsheets to unlock data.   Data saved with a previous flowsheet row definition        11/21/2023   12:35 PM 10/30/2023   11:52 AM 07/26/2022    3:32 PM 05/29/2022    9:22 AM  GAD 7 : Generalized Anxiety Score  Nervous, Anxious, on Edge  3  1  0   Control/stop worrying  2  1  0   Worry too much - different things  2  1  0   Trouble relaxing  0  1  0   Restless  0  0  0   Easily annoyed or irritable  1  0  0   Afraid - awful might happen  2  0  0   Total GAD 7 Score  10 4 0  Anxiety Difficulty  Not difficult at all Not difficult at all Not difficult at all     Information is confidential and restricted. Go to Review Flowsheets to unlock data.  Data saved with a previous flowsheet row definition    Assessment and Plan:  Pregnancy: G2P1001 at [redacted]w[redacted]d 1. Supervision of high risk pregnancy, antepartum (Primary) Up to date FH appropriate Vigorous movement Estimated Date of Delivery: 05/11/24   Concerns today: No tolerating metronidazole . Willing to try metrogel - sent in rx Confirmed desire for TOLAC-  In history says Low vertical but op note is LTCS. Will be 20 mon from last CS at delivery. reviewed r/b and needs formal consent and discussion.   2. Short interval between pregnancies affecting pregnancy in second trimester, antepartum  3. Obesity affecting pregnancy, antepartum, unspecified obesity type TWG=18 lb 12.8 oz (8.528 kg)   4. At risk for domestic violence  5. Anemia in pregnancy, third trimester Lab Results  Component Value Date    HGB 9.3 (L) 03/11/2024   HGB 8.8 (L) 02/25/2024   HGB 9.4 (L) 02/22/2024   Has venofer  infusions, last on 2/3 Recheck about 3 weeks after last, so late feb  6. [redacted] weeks gestation of pregnancy   Preterm labor symptoms and general obstetric precautions including but not limited to vaginal bleeding, contractions, leaking of fluid and fetal movement were reviewed in detail with the patient. Please refer to After Visit Summary for other counseling recommendations.   Return in about 2 weeks (around 03/19/2024) for Routine prenatal care, Mom+Baby Combined Care.  Future Appointments  Date Time Provider Department Center  03/17/2024 11:00 AM [facility] PROVIDER 1 [facility]  03/17/2024 11:30 AM [facility] US1 [facility]  03/18/2024  9:15 AM [facility]IR 8 [facility] None  03/19/2024  3:15 PM Ilean Norleen GAILS, MD [facility]  04/02/2024  3:15 PM Eldonna Suzen Octave, MD [facility]  04/16/2024  3:15 PM Eldonna Suzen Octave, MD [facility]  04/23/2024  3:55 PM [facility] MOM BABY DYAD PROVIDER [facility]  04/30/2024  3:15 PM [facility] MOM BABY DYAD PROVIDER [facility]  05/07/2024  3:55 PM [facility] MOM BABY DYAD PROVIDER [facility]  05/14/2024  3:15 PM [facility] MOM BABY DYAD PROVIDER [facility]    Suzen Octave Eldonna, MD "

## 2024-03-06 ENCOUNTER — Other Ambulatory Visit: Payer: Self-pay

## 2024-03-06 ENCOUNTER — Inpatient Hospital Stay (HOSPITAL_COMMUNITY)
Admission: AD | Admit: 2024-03-06 | Discharge: 2024-03-06 | Disposition: A | Discharge status: Home / Self Care | Payer: Self-pay | Attending: Obstetrics & Gynecology | Admitting: Obstetrics & Gynecology

## 2024-03-06 DIAGNOSIS — O368130 Decreased fetal movements, third trimester, not applicable or unspecified: Secondary | ICD-10-CM | POA: Diagnosis present

## 2024-03-06 DIAGNOSIS — O99013 Anemia complicating pregnancy, third trimester: Secondary | ICD-10-CM

## 2024-03-06 DIAGNOSIS — Z3A30 30 weeks gestation of pregnancy: Secondary | ICD-10-CM | POA: Insufficient documentation

## 2024-03-06 DIAGNOSIS — O26893 Other specified pregnancy related conditions, third trimester: Secondary | ICD-10-CM | POA: Insufficient documentation

## 2024-03-06 DIAGNOSIS — Z9189 Other specified personal risk factors, not elsewhere classified: Secondary | ICD-10-CM

## 2024-03-06 DIAGNOSIS — K0889 Other specified disorders of teeth and supporting structures: Secondary | ICD-10-CM | POA: Diagnosis not present

## 2024-03-06 DIAGNOSIS — O0990 Supervision of high risk pregnancy, unspecified, unspecified trimester: Secondary | ICD-10-CM

## 2024-03-06 NOTE — MAU Provider Note (Signed)
 History      [id number]   Arrival date and time: 03/06/24 1021        Chief Complaint  Patient presents with   Decreased Fetal Movement   Dental Pain    HPI   Sandra Booth is a 19 y.o. at [redacted]w[redacted]d by ultrasound with no significant PMHx who presents for decreased fetal movement.    Patient reports no fetal movements this morning when she woke up at 8 am. Reports last felt movement overnight. Reports drinking cold beverages without response. Denies fever, headaches, vision changes, nausea, vomiting, abdominal pain, CTX, LOF, and vaginal bleeding     O/Positive/-- (09/16 1154)   OB History       Gravida  2   Para  1   Term  1   Preterm  0   AB  0   Living  1        SAB  0   IAB  0   Ectopic  0   Multiple  0   Live Births  1                   Past Medical History:  Diagnosis Date   Anxiety     Constipation     Depression      doing better-situational               Past Surgical History:  Procedure Laterality Date   CESAREAN SECTION N/A 08/28/2022    Procedure: CESAREAN SECTION;  Surgeon: Jayne Vonn DEL, MD;  Location: [facility] LD [facility]S;  Service: Obstetrics;  Laterality: N/A;               Family History  Problem Relation Age of Onset   Diabetes Mother     Sickle cell anemia Mother     Anemia Father     Diabetes Brother     Heart Problems Paternal Grandmother            Social History         Socioeconomic History   Marital status: Single      Spouse name: Not on file   Number of children: Not on file   Years of education: Not on file   Highest education level: Not on file  Occupational History   Not on file  Tobacco Use   Smoking status: Former      Types: Cigars      Passive exposure: Current   Smokeless tobacco: Never  Vaping Use   Vaping status: Former   Quit date: 05/29/2022   Substances: Nicotine, Flavoring  Substance and Sexual Activity   Alcohol use: Not Currently      Comment: twice a year prior to pregnancy   Drug  use: Yes      Frequency: 7.0 times per week      Types: Marijuana      Comment: last marijuana use 01/2024   Sexual activity: Yes      Birth control/protection: None  Other Topics Concern   Not on file  Social History Narrative   Not on file    Social Drivers of Health        Tobacco Use: Medium Risk (02/29/2024)    Patient History     Smoking Tobacco Use: Former     Smokeless Tobacco Use: Never     Passive Exposure: Current  Physicist, Medical Strain: High Risk (03/30/2023)    Received from [facility]    Overall  Financial Resource Strain (CARDIA)     Difficulty of Paying Living Expenses: Hard  Food Insecurity: No Food Insecurity (12/22/2023)    Epic     Worried About Programme Researcher, Broadcasting/film/video in the Last Year: Never true     Ran Out of Food in the Last Year: Never true  Recent Concern: Food Insecurity - Food Insecurity Present (10/30/2023)    Epic     Worried About Programme Researcher, Broadcasting/film/video in the Last Year: Sometimes true     [employer] of Food in the Last Year: Sometimes true  Transportation Needs: No Transportation Needs (12/22/2023)    Epic     Lack of Transportation (Medical): No     Lack of Transportation (Non-Medical): No  Recent Concern: Transportation Needs - Unmet Transportation Needs (10/30/2023)    Epic     Lack of Transportation (Medical): Yes     Lack of Transportation (Non-Medical): Yes  Physical Activity: Not on file  Stress: Not on file  Social Connections: Not on file  Intimate Partner Violence: Not At Risk (12/22/2023)    Epic     Fear of Current or Ex-Partner: No     Emotionally Abused: No     Physically Abused: No     Sexually Abused: No  Depression (PHQ2-9): Medium Risk (11/21/2023)    Depression (PHQ2-9)     PHQ-2 Score: 5  Alcohol Screen: Not on file  Housing: High Risk (12/22/2023)    Epic     Unable to Pay for Housing in the Last Year: No     Number of Times Moved in the Last Year: 2     Homeless in the Last Year: No  Utilities: Not At  Risk (12/22/2023)    Epic     Threatened with loss of utilities: No  Health Literacy: Not on file      [Allergies]  [Allergies] No Known Allergies   [Medications Ordered Prior to [employer] Ordered Prior to [employer] No current facility-administered medications on file prior to encounter.          Current Outpatient Medications on File Prior to Encounter  Medication Sig Dispense Refill   ferrous sulfate  325 (65 FE) MG EC tablet Take 1 tablet (325 mg total) by mouth every other day. 45 tablet 1   prenatal vitamin w/FE, FA (PRENATAL 1 + 1) 27-1 MG TABS tablet Take 1 tablet by mouth daily at 12 noon. 30 tablet 11   terconazole  (TERAZOL 3 ) 0.8 % vaginal cream Place 1 applicator vaginally at bedtime. 20 g 0   metroNIDAZOLE  (FLAGYL ) 500 MG tablet Take 1 tablet (500 mg total) by mouth 2 (two) times daily. (Patient not taking: Reported on 03/05/2024) 14 tablet 0   sertraline  (ZOLOFT ) 50 MG tablet Take 0.5 tablets (25 mg total) by mouth daily for 14 days, THEN 1 tablet (50 mg total) daily for 14 days. (Patient not taking: Reported on 03/06/2024) 21 tablet 0       ROS Denies vaginal bleeding, leaking of fluid, contractions. Pertinent positives and negative per HPI, all others reviewed and negative   Physical Exam    BP 114/63 (BP Location: Right Arm)   Pulse (!) 115   Temp 98.6 F (37 C)   Resp 16   LMP 08/05/2023 (Exact Date)   SpO2 100%    Patient Vitals for the past 24 hrs:   BP Temp Pulse Resp SpO2  03/06/24 1040 114/63 98.6 F (37 C) (!) 115 16 100 %  Physical Exam Constitutional:      General: She is not in acute distress.    Appearance: Normal appearance. She is not toxic-appearing.  HENT:     Head: Normocephalic.  Cardiovascular:     Rate and Rhythm: Normal rate and regular rhythm.  Pulmonary:     Effort: Pulmonary effort is normal. No respiratory distress.  Abdominal:     Tenderness: There is no abdominal tenderness.  Skin:    General: Skin  is warm and dry.  Neurological:     Mental Status: She is alert.     Cervical Exam  Deferred   Bedside Ultrasound Deferred     FHT Baseline 140, moderate variability, accels present, no decels Toco: none Cat: 1   Labs Lab Results Last 24 Hours  No results found for this or any previous visit (from the past 24 hours).     Imaging No results found.   MAU Course  Procedures Lab Orders  No laboratory test(s) ordered today    No orders of the defined types were placed in this encounter.   Imaging Orders  No imaging studies ordered today        Assessment and Plan  Sandra Booth is a 19 y.o. at [redacted]w[redacted]d by ultrasound with no significant PMHx who presents for decreased fetal movement.   Decreased fetal movement Patient reports DFM unresponsive to conservative measures. Denies abdominal pain, CTX, trauma, and vaginal bleeding. On assessment patient reports fetal movements. Patient is HDS. ST showing moderate variability, accelerations, no D cells, appropriate basal rate.  She was feeling considerable amount of movement while on the monitor and the monitor was also picking up movement.  Lengthy discussion with patient regarding kick counts and return precautions.  Patient and baby appears well and safe for discharge.    #FWB FHT Cat 1 NST: reactive     Dispo: discharged to home in stable condition.     Eliezer Dickens, Medical Student 03/06/24 11:57 AM   Allergies as of 03/06/2024   No Known Allergies         Medication List       TAKE these medications     amoxicillin  500 MG capsule Commonly known as: AMOXIL  Take 1 capsule (500 mg total) by mouth 3 (three) times daily.    ferrous sulfate  325 (65 FE) MG EC tablet Take 1 tablet (325 mg total) by mouth every other day.    metroNIDAZOLE  0.75 % vaginal gel Commonly known as: METROGEL  Place 1 Applicatorful vaginally at bedtime. Apply one applicatorful to vagina at bedtime for 5 days    metroNIDAZOLE  500 MG  tablet Commonly known as: FLAGYL  Take 1 tablet (500 mg total) by mouth 2 (two) times daily.    prenatal vitamin w/FE, FA 27-1 MG Tabs tablet Take 1 tablet by mouth daily at 12 noon.    sertraline  50 MG tablet Commonly known as: Zoloft  Take 0.5 tablets (25 mg total) by mouth daily for 14 days, THEN 1 tablet (50 mg total) daily for 14 days. Start taking on: February 19, 2024    terconazole  0.8 % vaginal cream Commonly known as: TERAZOL 3  Place 1 applicator vaginally at bedtime.           Attending Attestation   I saw and evaluated the patient, performing the key elements of the service.I  personally performed or re-performed the history, physical exam, and medical decision making activities of this service and have verified that the service and findings are accurately documented  in the student's note. I developed the management plan that is described in the student's note, and I agree with the content, with my edits above.    Steffan Rover, MD Attending Family Medicine Physician, [facility] for [facility], [facility]

## 2024-03-06 NOTE — H&P (Deleted)
 " History     [id number]  Arrival date and time: 03/06/24 1021    Chief Complaint  Patient presents with   Decreased Fetal Movement   Dental Pain   HPI  Sandra Booth is a 19 y.o. at [redacted]w[redacted]d by ultrasound with no significant PMHx who presents for decreased fetal movement.   Patient reports no fetal movements this morning when she woke up at 8 am. Reports last felt movement overnight. Reports drinking cold beverages without response. Denies fever, headaches, vision changes, nausea, vomiting, abdominal pain, CTX, LOF, and vaginal bleeding   O/Positive/-- (09/16 1154)  OB History     Gravida  2   Para  1   Term  1   Preterm  0   AB  0   Living  1      SAB  0   IAB  0   Ectopic  0   Multiple  0   Live Births  1           Past Medical History:  Diagnosis Date   Anxiety    Constipation    Depression    doing better-situational    Past Surgical History:  Procedure Laterality Date   CESAREAN SECTION N/A 08/28/2022   Procedure: CESAREAN SECTION;  Surgeon: Jayne Vonn DEL, MD;  Location: [facility];  Service: Obstetrics;  Laterality: N/A;    Family History  Problem Relation Age of Onset   Diabetes Mother    Sickle cell anemia Mother    Anemia Father    Diabetes Brother    Heart Problems Paternal Grandmother     Social History   Socioeconomic History   Marital status: Single    Spouse name: Not on file   Number of children: Not on file   Years of education: Not on file   Highest education level: Not on file  Occupational History   Not on file  Tobacco Use   Smoking status: Former    Types: Cigars    Passive exposure: Current   Smokeless tobacco: Never  Vaping Use   Vaping status: Former   Quit date: 05/29/2022   Substances: Nicotine, Flavoring  Substance and Sexual Activity   Alcohol use: Not Currently    Comment: twice a year prior to pregnancy   Drug use: Yes    Frequency: 7.0 times per week    Types: Marijuana    Comment: last  marijuana use 01/2024   Sexual activity: Yes    Birth control/protection: None  Other Topics Concern   Not on file  Social History Narrative   Not on file   Social Drivers of Health   Tobacco Use: Medium Risk (02/29/2024)   Patient History    Smoking Tobacco Use: Former    Smokeless Tobacco Use: Never    Passive Exposure: Current  Physicist, Medical Strain: High Risk (03/30/2023)   Received from [facility]   Overall Financial Resource Strain (CARDIA)    Difficulty of Paying Living Expenses: Hard  Food Insecurity: No Food Insecurity (12/22/2023)   Epic    Worried About Programme Researcher, Broadcasting/film/video in the Last Year: Never true    Ran Out of Food in the Last Year: Never true  Recent Concern: Food Insecurity - Food Insecurity Present (10/30/2023)   Epic    Worried About Programme Researcher, Broadcasting/film/video in the Last Year: Sometimes true    Ran Out of Food in the Last Year: Sometimes true  Transportation Needs:  No Transportation Needs (12/22/2023)   Epic    Lack of Transportation (Medical): No    Lack of Transportation (Non-Medical): No  Recent Concern: Transportation Needs - Unmet Transportation Needs (10/30/2023)   Epic    Lack of Transportation (Medical): Yes    Lack of Transportation (Non-Medical): Yes  Physical Activity: Not on file  Stress: Not on file  Social Connections: Not on file  Intimate Partner Violence: Not At Risk (12/22/2023)   Epic    Fear of Current or Ex-Partner: No    Emotionally Abused: No    Physically Abused: No    Sexually Abused: No  Depression (PHQ2-9): Medium Risk (11/21/2023)   Depression (PHQ2-9)    PHQ-2 Score: 5  Alcohol Screen: Not on file  Housing: High Risk (12/22/2023)   Epic    Unable to Pay for Housing in the Last Year: No    Number of Times Moved in the Last Year: 2    Homeless in the Last Year: No  Utilities: Not At Risk (12/22/2023)   Epic    Threatened with loss of utilities: No  Health Literacy: Not on file     Allergies[1]  Medications Ordered Prior to Encounter[2]   ROS Denies vaginal bleeding, leaking of fluid, contractions. Pertinent positives and negative per HPI, all others reviewed and negative  Physical Exam   BP 114/63 (BP Location: Right Arm)   Pulse (!) 115   Temp 98.6 F (37 C)   Resp 16   LMP 08/05/2023 (Exact Date)   SpO2 100%   Patient Vitals for the past 24 hrs:  BP Temp Pulse Resp SpO2  03/06/24 1040 114/63 98.6 F (37 C) (!) 115 16 100 %    Physical Exam Constitutional:      General: She is not in acute distress.    Appearance: Normal appearance. She is not toxic-appearing.  HENT:     Head: Normocephalic.  Cardiovascular:     Rate and Rhythm: Normal rate and regular rhythm.  Pulmonary:     Effort: Pulmonary effort is normal. No respiratory distress.  Abdominal:     Tenderness: There is no abdominal tenderness.  Skin:    General: Skin is warm and dry.  Neurological:     Mental Status: She is alert.     Cervical Exam  Deferred  Bedside Ultrasound Deferred   FHT Baseline 140, moderate variability, accels present, no decels Toco: none Cat: 1  Labs No results found for this or any previous visit (from the past 24 hours).  Imaging No results found.  MAU Course  Procedures Lab Orders  No laboratory test(s) ordered today   No orders of the defined types were placed in this encounter.  Imaging Orders  No imaging studies ordered today     Assessment and Plan  Sandra Booth is a 19 y.o. at [redacted]w[redacted]d by ultrasound with no significant PMHx who presents for decreased fetal movement.  Decreased fetal movement Patient reports DFM unresponsive to conservative measures. Denies abdominal pain, CTX, trauma, and vaginal bleeding. On assessment patient reports fetal movements. Patient is HDS. ST showing moderate variability, accelerations, no D cells, appropriate basal rate.  She was feeling considerable amount of movement while on the monitor and  the monitor was also picking up movement.  Lengthy discussion with patient regarding kick counts and return precautions.  Patient and baby appears well and safe for discharge.   #FWB FHT Cat 1 NST: reactive   Dispo: discharged to home in stable condition.  Eliezer Dickens, Medical Student 03/06/24 11:57 AM  Allergies as of 03/06/2024   No Known Allergies      Medication List     TAKE these medications    amoxicillin  500 MG capsule Commonly known as: AMOXIL  Take 1 capsule (500 mg total) by mouth 3 (three) times daily.   ferrous sulfate  325 (65 FE) MG EC tablet Take 1 tablet (325 mg total) by mouth every other day.   metroNIDAZOLE  0.75 % vaginal gel Commonly known as: METROGEL  Place 1 Applicatorful vaginally at bedtime. Apply one applicatorful to vagina at bedtime for 5 days   metroNIDAZOLE  500 MG tablet Commonly known as: FLAGYL  Take 1 tablet (500 mg total) by mouth 2 (two) times daily.   prenatal vitamin w/FE, FA 27-1 MG Tabs tablet Take 1 tablet by mouth daily at 12 noon.   sertraline  50 MG tablet Commonly known as: Zoloft  Take 0.5 tablets (25 mg total) by mouth daily for 14 days, THEN 1 tablet (50 mg total) daily for 14 days. Start taking on: February 19, 2024   terconazole  0.8 % vaginal cream Commonly known as: TERAZOL 3  Place 1 applicator vaginally at bedtime.        Attending Attestation  I saw and evaluated the patient, performing the key elements of the service.I  personally performed or re-performed the history, physical exam, and medical decision making activities of this service and have verified that the service and findings are accurately documented in the student's note. I developed the management plan that is described in the student's note, and I agree with the content, with my edits above.    Steffan Rover, MD Attending Family Medicine Physician, [facility], [facility]      [1] No Known  Allergies [2]  No current facility-administered medications on file prior to encounter.   Current Outpatient Medications on File Prior to Encounter  Medication Sig Dispense Refill   ferrous sulfate  325 (65 FE) MG EC tablet Take 1 tablet (325 mg total) by mouth every other day. 45 tablet 1   prenatal vitamin w/FE, FA (PRENATAL 1 + 1) 27-1 MG TABS tablet Take 1 tablet by mouth daily at 12 noon. 30 tablet 11   terconazole  (TERAZOL 3 ) 0.8 % vaginal cream Place 1 applicator vaginally at bedtime. 20 g 0   metroNIDAZOLE  (FLAGYL ) 500 MG tablet Take 1 tablet (500 mg total) by mouth 2 (two) times daily. (Patient not taking: Reported on 03/05/2024) 14 tablet 0   sertraline  (ZOLOFT ) 50 MG tablet Take 0.5 tablets (25 mg total) by mouth daily for 14 days, THEN 1 tablet (50 mg total) daily for 14 days. (Patient not taking: Reported on 03/06/2024) 21 tablet 0   "

## 2024-03-06 NOTE — Discharge Instructions (Signed)
 It was a pleasure taking care of you today.  I am glad your baby is started moving really well.  If you have worries sit in a quiet room without any distractions and do kick counts.  You should feel 5 movements every hour or 10 movements in 2 hours.  If you have any worries or concerns return for further evaluation.  I will see you in 2 weeks in the clinic.

## 2024-03-06 NOTE — MAU Note (Signed)
 Sandra Booth is a 19 y.o. at [redacted]w[redacted]d here in MAU reporting: last fetal movement felt at 0400  None this am with food and water . Marker given I was called in medication for my tooth pain but was not able to pick up yesterday. Finished med for yeast infection but was RX given for BV since she could not take the pills yesterday . Can pick up both meds today.  LMP:  Onset of complaint: this AM DFM Pain score: 9/10 Vitals:   03/06/24 1040  BP: 114/63  Pulse: (!) 115  Resp: 16  Temp: 98.6 F (37 C)  SpO2: 100%     FHT: 140  Lab orders placed from triage: none

## 2024-03-11 ENCOUNTER — Ambulatory Visit

## 2024-03-11 ENCOUNTER — Other Ambulatory Visit: Payer: Self-pay

## 2024-03-11 ENCOUNTER — Inpatient Hospital Stay (HOSPITAL_COMMUNITY)
Admission: AD | Admit: 2024-03-11 | Discharge: 2024-03-11 | Disposition: A | Discharge status: Home / Self Care | Payer: Self-pay | Attending: Obstetrics and Gynecology | Admitting: Obstetrics and Gynecology

## 2024-03-11 ENCOUNTER — Encounter (HOSPITAL_COMMUNITY): Payer: Self-pay | Admitting: Obstetrics and Gynecology

## 2024-03-11 DIAGNOSIS — R002 Palpitations: Secondary | ICD-10-CM | POA: Insufficient documentation

## 2024-03-11 DIAGNOSIS — Z3A31 31 weeks gestation of pregnancy: Secondary | ICD-10-CM | POA: Diagnosis not present

## 2024-03-11 DIAGNOSIS — O99013 Anemia complicating pregnancy, third trimester: Secondary | ICD-10-CM

## 2024-03-11 DIAGNOSIS — D509 Iron deficiency anemia, unspecified: Secondary | ICD-10-CM | POA: Diagnosis not present

## 2024-03-11 DIAGNOSIS — Z87891 Personal history of nicotine dependence: Secondary | ICD-10-CM | POA: Insufficient documentation

## 2024-03-11 DIAGNOSIS — F5089 Other specified eating disorder: Secondary | ICD-10-CM | POA: Insufficient documentation

## 2024-03-11 LAB — URINALYSIS, ROUTINE W REFLEX MICROSCOPIC
Bilirubin Urine: NEGATIVE
Glucose, UA: NEGATIVE mg/dL
Hgb urine dipstick: NEGATIVE
Ketones, ur: NEGATIVE mg/dL
Leukocytes,Ua: NEGATIVE
Nitrite: NEGATIVE
Protein, ur: NEGATIVE mg/dL
Specific Gravity, Urine: 1.015 (ref 1.005–1.030)
pH: 7 (ref 5.0–8.0)

## 2024-03-11 LAB — CBC
HCT: 30.9 % — ABNORMAL LOW (ref 36.0–46.0)
Hemoglobin: 9.3 g/dL — ABNORMAL LOW (ref 12.0–15.0)
MCH: 23.4 pg — ABNORMAL LOW (ref 26.0–34.0)
MCHC: 30.1 g/dL (ref 30.0–36.0)
MCV: 77.8 fL — ABNORMAL LOW (ref 80.0–100.0)
Platelets: 182 10*3/uL (ref 150–400)
RBC: 3.97 MIL/uL (ref 3.87–5.11)
RDW: 20 % — ABNORMAL HIGH (ref 11.5–15.5)
WBC: 7.5 10*3/uL (ref 4.0–10.5)
nRBC: 0 % (ref 0.0–0.2)

## 2024-03-11 LAB — BASIC METABOLIC PANEL WITH GFR
Anion gap: 8 (ref 5–15)
BUN: <5 mg/dL — ABNORMAL LOW (ref 6–20)
CO2: 24 mmol/L (ref 22–32)
Calcium: 8.4 mg/dL — ABNORMAL LOW (ref 8.9–10.3)
Chloride: 104 mmol/L (ref 98–111)
Creatinine, Ser: 0.45 mg/dL (ref 0.44–1.00)
GFR, Estimated: >60 mL/min
Glucose, Bld: 94 mg/dL (ref 70–99)
Potassium: 3.6 mmol/L (ref 3.5–5.1)
Sodium: 136 mmol/L (ref 135–145)

## 2024-03-11 MED ORDER — SODIUM CHLORIDE 0.9 % IV BOLUS
1000.0000 mL | Freq: Once | INTRAVENOUS | Status: AC
  Administered 2024-03-11: 1000 mL via INTRAVENOUS

## 2024-03-11 MED ORDER — IRON SUCROSE 200 MG IVPB - SIMPLE MED
200.0000 mg | Freq: Once | Status: AC
  Administered 2024-03-11: 200 mg via INTRAVENOUS
  Filled 2024-03-11: qty 200

## 2024-03-11 NOTE — MAU Note (Addendum)
 MAU Triage Note  Sandra Booth is a 19 y.o. at [redacted]w[redacted]d here in MAU reporting: has iron  deficiency anemia, reports yesterday when she stood up from the couch, she had an episode of dizziness and felt faint, she lost her balance. Reports she started to feel normal approx 5 minutes after the episode. reports the same thing happened 4 days ago. she had an iron  infusion appointment scheduled for this morning. Her uber mistakenly brought her here instead of the infusion clinic. Denies VB and LOF. Endorses +FM  Pain score: denies Vitals:   03/11/24 1108  BP: 118/67  Pulse: (!) 103  Resp: 18  Temp: 98.4 F (36.9 C)  SpO2: 100%     FHT: 134  Lab orders placed from triage: UA

## 2024-03-11 NOTE — MAU Provider Note (Cosign Needed Addendum)
 " History     CSN: [id number]  Arrival date and time: 03/11/24 1043   None     Chief Complaint  Patient presents with   Dizziness   HPI Patient presents today with intermittent dizziness.  Was supposed to attend iron  infusion appointment today but transportation drop-off was at the wrong location and she missed her appointment.  Last episode was 4 days ago and lasted several minutes.  Sometimes accompanied by heart palpitations.  No shortness of breath.  No falls.  Pica has improved.  Trying to eat more and drink more water . No vaginal bleeding, decreased fetal movement, vaginal discharge, or contractions.   OB History     Gravida  2   Para  1   Term  1   Preterm  0   AB  0   Living  1      SAB  0   IAB  0   Ectopic  0   Multiple  0   Live Births  1           Past Medical History:  Diagnosis Date   Anxiety    Constipation    Depression    doing better-situational    Past Surgical History:  Procedure Laterality Date   CESAREAN SECTION N/A 08/28/2022   Procedure: CESAREAN SECTION;  Surgeon: Jayne Vonn DEL, MD;  Location: [facility] LD ORS;  Service: Obstetrics;  Laterality: N/A;    Family History  Problem Relation Age of Onset   Diabetes Mother    Sickle cell anemia Mother    Anemia Father    Diabetes Brother    Heart Problems Paternal Grandmother     Social History[1]  Allergies: Allergies[2]  No medications prior to admission.    Review of Systems  Constitutional:  Positive for fatigue.  Respiratory:  Negative for chest tightness and shortness of breath.   Cardiovascular:  Negative for chest pain, palpitations and leg swelling.  Genitourinary:  Negative for vaginal bleeding and vaginal discharge.  Neurological:  Positive for dizziness and light-headedness.   Physical Exam   Blood pressure 114/68, pulse 83, temperature 98 F (36.7 C), temperature source Oral, resp. rate 17, height 5' 2 (1.575 m), weight 93.5 kg, last menstrual period  08/05/2023, SpO2 99%, currently breastfeeding.  Physical Exam Constitutional:      Appearance: Normal appearance.  HENT:     Mouth/Throat:     Mouth: Mucous membranes are dry.  Cardiovascular:     Rate and Rhythm: Normal rate and regular rhythm.  Pulmonary:     Effort: Pulmonary effort is normal.     Breath sounds: Normal breath sounds.  Skin:    General: Skin is warm and dry.  Neurological:     Mental Status: She is alert.     MAU Course  Procedures - Iron  infusion  MDM -low  Patient is a 19 year old G2P1 at [redacted]w[redacted]d who presents with ongoing intermittent dizziness in the setting of known iron  deficiency anemia.  She missed her appointment for iron  infusion today due to transportation issues. - Vital signs normal (slightly tachycardic at intake but then normalized)\ - FHT reassuring - Physical exam reassuring - BMP within normal limits - CBC showing Hb improved to 9.3 from 8.8 2 weeks ago - Iron  infusion received in the MAU today  Assessment and Plan   Iron  deficiency anemia Hb 9.3, received iron  infusion Follow-up outpatient (next MFM appointment 03/17/24) Patient may return to MAU as needed  Garen SHAUNNA Puffer 03/11/2024, 11:20  AM     Attestation of Supervision of Student:  I confirm that I have verified the information documented in the resident's note and that I have also personally performed the history, physical exam and all medical decision making activities.  I have verified that all services and findings are accurately documented in this student's note; and I agree with management and plan as outlined in the documentation. I have also made any necessary editorial changes.  NST:  Baseline: 135 bpm, Variability: Good {> 6 bpm), Accelerations: Reactive, and Decelerations: Absent  -Decision made to give patient IV iron  in MAU today as she has transportation issues & missed her appointment. She is scheduled for f/u infusion 2/3  Rocky Satterfield, NP [facility] for  [employer] 03/11/2024 8:12 PM     [1]  Social History Tobacco Use   Smoking status: Former    Types: Cigars    Passive exposure: Current   Smokeless tobacco: Never  Vaping Use   Vaping status: Former   Quit date: 05/29/2022   Substances: Nicotine, Flavoring  Substance Use Topics   Alcohol use: Not Currently    Comment: twice a year prior to pregnancy   Drug use: Not Currently    Frequency: 7.0 times per week    Types: Marijuana    Comment: last marijuana use 01/2024  [2] No Known Allergies  "

## 2024-03-17 ENCOUNTER — Ambulatory Visit

## 2024-03-18 ENCOUNTER — Ambulatory Visit

## 2024-03-19 ENCOUNTER — Ambulatory Visit: Admitting: Family Medicine

## 2024-03-19 ENCOUNTER — Other Ambulatory Visit: Payer: Self-pay

## 2024-03-19 VITALS — BP 96/63 | HR 97 | Wt 206.4 lb

## 2024-03-19 DIAGNOSIS — Z3A32 32 weeks gestation of pregnancy: Secondary | ICD-10-CM

## 2024-03-19 DIAGNOSIS — O368190 Decreased fetal movements, unspecified trimester, not applicable or unspecified: Secondary | ICD-10-CM

## 2024-03-19 DIAGNOSIS — O99013 Anemia complicating pregnancy, third trimester: Secondary | ICD-10-CM

## 2024-03-19 DIAGNOSIS — O09892 Supervision of other high risk pregnancies, second trimester: Secondary | ICD-10-CM

## 2024-03-19 DIAGNOSIS — O0990 Supervision of high risk pregnancy, unspecified, unspecified trimester: Secondary | ICD-10-CM | POA: Diagnosis not present

## 2024-03-19 DIAGNOSIS — O99210 Obesity complicating pregnancy, unspecified trimester: Secondary | ICD-10-CM

## 2024-03-21 NOTE — Progress Notes (Signed)
 "  PRENATAL VISIT NOTE  Subjective:  Sandra Booth is a 19 y.o. G2P1001 at [redacted]w[redacted]d being seen today for ongoing prenatal care.  She is currently monitored for the following issues for this high-risk pregnancy and has MDD (major depressive disorder), single episode, severe , no psychosis (HCC); Cannabis use disorder, mild, abuse; Chronic constipation; Anxiety; At risk for domestic violence; Pica in adults; Carbon monoxide exposure; History of cesarean delivery; Supervision of high risk pregnancy, antepartum; Short interval between pregnancies affecting pregnancy in second trimester, antepartum; High risk teen pregnancy; Obesity affecting pregnancy, antepartum; Anemia in pregnancy, third trimester; and Pain due to dental caries on their problem list.  Patient reports .  Contractions: Not present. Vag. Bleeding: None.  Movement: (!) Decreased. Denies leaking of fluid.   The following portions of the patient's history were reviewed and updated as appropriate: allergies, current medications, past family history, past medical history, past social history, past surgical history and problem list.   Objective:   Vitals:   03/19/24 1531  BP: 96/63  Pulse: 97  Weight: 206 lb 6.4 oz (93.6 kg)    Fetal Status:  Fetal Heart Rate (bpm): 133   Movement: (!) Decreased    General: Alert, oriented and cooperative. Patient is in no acute distress.  Skin: Skin is warm and dry. No rash noted.   Cardiovascular: Normal heart rate noted  Respiratory: Normal respiratory effort, no problems with respiration noted  Abdomen: Soft, gravid, appropriate for gestational age.  Pain/Pressure: Absent     Pelvic: Cervical exam deferred        Extremities: Normal range of motion.  Edema: None  Mental Status: Normal mood and affect. Normal behavior. Normal judgment and thought content.      11/21/2023   12:40 PM 10/30/2023   11:52 AM 07/26/2022    3:32 PM  Depression screen PHQ 2/9  Decreased Interest  2 0  Down,  Depressed, Hopeless  0 0  PHQ - 2 Score  2 0  Altered sleeping  2 0  Tired, decreased energy  3 3  Change in appetite  0 0  Feeling bad or failure about yourself   0 0  Trouble concentrating  0 0  Moving slowly or fidgety/restless  0 0  Suicidal thoughts  0 0  PHQ-9 Score  7  3   Difficult doing work/chores  Not difficult at all Not difficult at all     Information is confidential and restricted. Go to Review Flowsheets to unlock data.   Data saved with a previous flowsheet row definition        11/21/2023   12:35 PM 10/30/2023   11:52 AM 07/26/2022    3:32 PM 05/29/2022    9:22 AM  GAD 7 : Generalized Anxiety Score  Nervous, Anxious, on Edge  3  1  0   Control/stop worrying  2  1  0   Worry too much - different things  2  1  0   Trouble relaxing  0  1  0   Restless  0  0  0   Easily annoyed or irritable  1  0  0   Afraid - awful might happen  2  0  0   Total GAD 7 Score  10 4 0  Anxiety Difficulty  Not difficult at all Not difficult at all Not difficult at all     Information is confidential and restricted. Go to Review Flowsheets to unlock data.   Data  saved with a previous flowsheet row definition    Assessment and Plan:  Pregnancy: G2P1001 at [redacted]w[redacted]d 1. Supervision of high risk pregnancy, antepartum (Primary) FHR BP appropriate today  2. Short interval between pregnancies affecting pregnancy in second trimester, antepartum  3. Obesity affecting pregnancy, antepartum, unspecified obesity type  4. Decreased fetal movement during pregnancy, antepartum, single or unspecified fetus Patient has been seen in the MAU recently for decreased fetal movement.  Patient reports that she continues to have decreased fetal movement but also reports that she does not do kick counts because it is difficult for her to do them with a toddler.  While in the room she reports that she feels good fetal movement.  NST performed today showing moderate variability, accelerations, no decelerations,  appropriate based fetal heart rate.  While on the monitor I could hear fetal movements and patient reporting she could not feel those movements.  May be that the patient does not perceive the movements.  Discussed the importance of kick counts and if she is concerned go to the MAU for further evaluation.    5. Anemia in pregnancy, third trimester Has received iron  infusions.  Plan to recheck hemoglobin at next visit  6. [redacted] weeks gestation of pregnancy   Preterm labor symptoms and general obstetric precautions including but not limited to vaginal bleeding, contractions, leaking of fluid and fetal movement were reviewed in detail with the patient. Please refer to After Visit Summary for other counseling recommendations.   No follow-ups on file.  Future Appointments  Date Time Provider Department Center  04/02/2024  3:15 PM Eldonna Suzen Octave, MD [facility]  04/16/2024  3:15 PM Eldonna Suzen Octave, MD [facility]  04/23/2024  3:55 PM Eldonna Suzen Octave, MD [facility]  04/30/2024  3:15 PM Trudy Leeroy NOVAK, MD [facility]  05/07/2024  3:55 PM Danny Geralds, DO [facility]  05/14/2024  3:15 PM Cashion, Barkley CROME, MD [facility]    Norleen LULLA Rover, MD  "

## 2024-03-24 ENCOUNTER — Ambulatory Visit

## 2024-04-02 ENCOUNTER — Encounter: Admitting: Family Medicine

## 2024-04-16 ENCOUNTER — Encounter: Admitting: Family Medicine

## 2024-04-23 ENCOUNTER — Encounter: Admitting: Family Medicine

## 2024-04-30 ENCOUNTER — Encounter

## 2024-05-07 ENCOUNTER — Encounter

## 2024-05-14 ENCOUNTER — Encounter: Admitting: Obstetrics and Gynecology
